# Patient Record
Sex: Male | Born: 1965 | Race: Asian | Hispanic: No | Marital: Married | Smoking: Never smoker
Health system: Southern US, Community
[De-identification: ages and names within clinical notes are randomized; demographics above are authoritative.]

---

## 2012-04-11 ENCOUNTER — Ambulatory Visit (INDEPENDENT_AMBULATORY_CARE_PROVIDER_SITE_OTHER): Payer: BC Managed Care – PPO | Admitting: Emergency Medicine

## 2012-04-11 VITALS — BP 121/73 | HR 83 | Temp 97.9°F | Resp 16 | Ht 66.0 in | Wt 163.4 lb

## 2012-04-11 DIAGNOSIS — J111 Influenza due to unidentified influenza virus with other respiratory manifestations: Secondary | ICD-10-CM

## 2012-04-11 DIAGNOSIS — R059 Cough, unspecified: Secondary | ICD-10-CM

## 2012-04-11 DIAGNOSIS — R05 Cough: Secondary | ICD-10-CM

## 2012-04-11 LAB — POCT INFLUENZA A/B: Influenza A, POC: NEGATIVE

## 2012-04-11 MED ORDER — OSELTAMIVIR PHOSPHATE 75 MG PO CAPS: 75.0000 mg | ORAL_CAPSULE | Freq: Two times a day (BID) | ORAL | Status: DC

## 2012-04-11 MED ORDER — HYDROCOD POLST-CHLORPHEN POLST 10-8 MG/5ML PO LQCR: 5.0000 mL | Freq: Two times a day (BID) | ORAL | Status: DC | PRN

## 2012-04-11 NOTE — Patient Instructions (Addendum)
Cm A (H1N1) (Influenza A [H1N1]) H1N1 tr??c ?y ???c g?i l "cm l?n" l m?t vi rt cm m?i gy m?t m?i cho ng??i. Vi rt H1N1 khc v?i cc vi rt cm theo ma. Tuy nhin, cc tri?u ch?ng c?a H1N1 t??ng t? nh? cm theo ma v n ly lan t? ng??i ny sang ng??i khc. B?n c th? c nguy c? b? nh?ng v?n ?? nghim tr?ng cao h?n n?u b?n c nh?ng tnh tr?ng y t? nghim tr?ng. CDC v T? Ch?c Y T? Th? Gi?i ?ang theo di cc tr??ng h?p ???c bo co trn kh?p th? gi?i. NGUYN NHN  Cm ???c xem l ly lan ch? y?u t? ng??i sang ng??i thng qua vi?c ho ho?c h?t h?i c?a ng??i b? nhi?m vi rt cm.  M?t ng??i c th? b? nhi?m cm khi ch?m vo th? g ? c vi rt trn ? v sau ? ch?m vo mi?ng ho?c m?i. TRI?U CH?NG  S?t.  ?au ??u.  M?t m?i.  Ho.  ?au h?ng.  Ch?y n??c m?i ho?c ng?t m?i.  ?au nh?c ton thn.  Tiu ch?y v nn m?a Nh?ng tri?u ch?ng ny ???c ?? c?p l nh?ng tri?u ch?ng 'gi?ng nh? cm'. Nhi?u tri?u ch?ng m?t m?i khc nhau bao g?m, c?m l?nh, c th? c nh?ng tri?u ch?ng t??ng t?. CH?N ?ON   C nh?ng xt nghi?m c th? cho bi?t b?n c b? nhi?m vi rt H1N1 khng.  Nh?ng tr??ng h?p ???c xc nh?n nhi?m H1N1 s? ???c bo co cho phng y t? qu?c gia ho?c ??a ph??ng.  Bc s? c th? c?n xt nghi?m ?? xc ??nh xem b?n c b? nhi?m trng do bi?n ch?ng c?a cm hay khng. H??NG D?N CH?M [place] T?I NH  Gi? c?p nh?t thng tin. Truy c?p vo trang web c?a CDC ?? bi?t nh?ng ?? xu?t m?i nh?t. Truy c?p vo [employer]tn. B?n c?ng c th? g?i ??n 1-800-CDC-INFO [phone]).  Yu c?u tr? gip s?m n?u b?n pht tri?n b?t k? tri?u ch?ng no ? trn.  N?u b?n c nguy c? cao t? bi?n ch?ng c?a cm, hy ni chuy?n v?i chuyn gia ch?m [place] y t? c?a b?n ngay sau khi b?n c nh?ng tri?u ch?ng gi?ng nh? cm. Nh?ng ng??i b? r?i ro cao h?n v?i bi?n ch?ng bao g?m:  Nh?ng ng??i t? 66 tu?i tr? ln.  Nh?ng ng??i c nh?ng tnh tr?ng y t? m?n tnh.  Ph? n? mang thai.  Tr? nh?.  Chuyn gia ch?m [place] y t? c  th? ?? xu?t thu?c khng vi rt ?? gip ?i?u tr? cm.  N?u b?n b? cm, hy ngh? ng?i th?t nhi?u, u?ng ?? n??c v dung d?ch ?? gi? cho n??c ti?u trong ho?c vng nh?t v trnh s? d?ng r??u ho?c thu?c l.  B?n c th? s? d?ng thu?c mua tr?c ti?p t?i hi?u thu?c ?? lm gi?m tri?u ch?ng cm n?u chuyn gia ch?m [place] y t? ph chu?n. (Khng bao gi? s? d?ng atpirin cho tr? em ho?c tr? v? thnh nin c nh?ng tri?u ch?ng gi?ng nh? cm, ??c bi?t khi b? s?t). ?I?U TR? N?u b? b?nh, b?n c th? s? d?ng thu?c khng vi rt. Nh?ng thu?c ny c th? gi?m nh? b?nh c?a b?n v lm cho b?n c?m th?y kh?e nhanh h?n. Nn ?i?u tr? ngay sau khi b?t ??u b? b?nh. N ch? c hi?u qu? n?u ???c s? d?ng trong ngy ??u tin khi b?t ??u b? b?nh. Ch? chuyn gia ch?m [place] y t? c?a b?n m?i  c th? k thu?c khng vi rt.  PHNG NG?A  Che m?i v mi?ng b?ng kh?n gi?y ho?c cnh tay khi ho ho?c h?t h?i. V?t b? kh?n gi?y.  R?a tay th??ng xuyn b?ng x phng v n??c ?m, ??c bi?t sau khi ho ho?c h?t h?i. Nh?ng ch?t t?y r?a c ch?a c?n c?ng hi?u qu? trong vi?c ch?ng l?i vi trng.  Trnh ch?m vo m?t, m?i ho?c mi?ng. ?y l m?t cch pht tn vi trng.  Trnh ti?p xc v?i ng??i b? b?nh. Tun th? khuy?n co y t? chung lin quan ??n vi?c ?ng c?a tr??ng h?c. Trnh ?m ?ng.  ? nh n?u b?n b? b?nh. H?n ch? ti?p xc v?i ng??i khc ?? trnh ly b?nh cho h?. Nh?ng ng??i b? nhi?m vi rt H1N1 c th? ly nhi?m sang ng??i khc b?t c? lc no t? 1 ngy tr??c khi c?m th?y b? b?nh cho ??n 5-7 ngy sau khi xu?t hi?n tri?u ch?ng cm.  V?cxin H1N1 c s?n ?? gip b?o v? ch?ng l?i vi rt. Ngoi v?cxin H1N1, b?n s? c?n tim phng H1N1 cho cm. V?cxin H1N1 v theo ma c th? s? d?ng trong cng ngy. CDC ??c bi?t khuyn nn s? d?ng v?cxin H1N1 cho:  Ph? n? mang [place].  Nh?ng ng??i s?ng cng v?i ho?c ch?m [place] tr? t? 6 thng tu?i tr? xu?ng.  Nhn vin d?ch v? ch?m [place] s?c kh?e v c?p c?u.  Nh?ng ng??i t? 6 thng tu?i ??n 24 tu?i.  Nh?ng ng??i ? ?? tu?i t? 25 ??n 64 c  nguy c? b? nhi?m H1N1 cao h?n do cc r?i lo?n s?c kh?e m?n tnh ho?c v?n ?? v? h? mi?n d?ch. M?T N? Trong mi tr??ng c?ng ??ng v t?i nh, vi?c s? d?ng m?t n? v kh?u trang N95 th??ng khng ???c khuyn dng. Trong m?t s? tr??ng h?p nh?t ??nh, m?t n? ho?c kh?u trang N95 c th? ???c s? d?ng cho nh?ng c nhn c nguy c? gia t?ng b? b?nh n?ng t? cm. Chuyn gia ch?m [place] y t? c?a b?n c th? t? v?n thm v? vi?c s? d?ng m?t n?. TRONG TR? EM, NH?NG D?U HI?U C?NH BO KH?N C?P C?N CH?M [place] Y T? KH?N C?P:  Th? g?p ho?c kh th?.  Mu da h?i xanh.  Khng u?ng ?? n??c.  Khng th?c gi?c ho?c khng t??ng tc bnh th??ng.  Om sm t?i m?c tr? khng mu?n ???c gi?Marland Kitchen  Nhi?t ?? ? mi?ng c?a con b?n trn 102 F (38,9 C), khng ???c ki?m sot b?i thu?c.  Con b?n trn 3 thng tu?i c nhi?t ?? ?o t?i h?u mn l 102 F (38,9 C) tr? ln.  Con b?n t? 3 thng tu?i tr? xu?ng c nhi?t ?? ?o t?i h?u mn l 100,4 F (38 C) tr? ln.  Tri?u ch?ng gi?ng nh? cm c?i thi?n nh?ng tr? l?i cng v?i s?t v ho t? h?n. TRONG NG??I L?N, NH?NG D?U HI?U C?NH BO KH?N C?P C?N CH?M [place] Y T? KH?N C?P:  Kh th? ho?c th? d?c.  ?au ho?c t?c ng?c ho?c b?ng.  Hoa m?t ??t ng?t.  S? l?n l?n.  Nn n?ng ho?c lin t?c.  Da h?i Angeline Slim.  Nhi?t ?? mi?ng c?a b?n trn 102 F (38,9 C), khng ???c ki?m sot b?i thu?c.  Tri?u ch?ng gi?ng nh? cm c?i thi?n nh?ng tr? l?i cng v?i s?t v ho t? h?n. HY NGAY L?P T?C THAM V?N V?I CHUYN GIA Y T? N?U:  B?n ho?c ai ? b?n bi?t  g?p ph?i b?t k? tri?u ch?ng no ? trn. Khi b?n ??n trung tm c?p c?u, hy ni cho nhn vin l? tn r?ng b?n ngh? mnh b? cm. B?n c th? ???c yu c?u ?eo m?t n? v/ho?c ng?i trong khu v?c cch ly ?? b?o v? nh?ng ng??i khc kh?i b? ly b?nh. ??M B?O B?N:   Hi?u cc h??ng d?n ny.  S? theo di tnh tr?ng c?a mnh.  S? yu c?u tr? gip ngay l?p t?c n?u b?n c?m th?y khng kh?e ho?c tnh tr?ng tr? nn t?i h?n. M?t s? thng tin ny ???c cung c?p mi?n ph b?i CDC.  Document  Released: 05/29/2009 Document Revised: 05/27/2011 [facility] Patient Information 2013 [place], [place].

## 2012-04-11 NOTE — Progress Notes (Signed)
[facility] [address] [place] [address] [phone]  Date:  04/11/2012   Name:  Manuel Boone   DOB:  12/12/1965   MRN:  [id number]  PCP:  No primary provider on file.    Chief Complaint: Cough   History of Present Illness:  Manuel Boone is a 47 y.o. very pleasant male patient who presents with the following:  Ill since yesterday with cough and fever, malaise, joint and muscle pain.  Cough is largely dry except in AM when he brings up some mucoid sputum.  Has some nasal congestion and drainage.  Had flu shot.  Ill contacts.  No improvement with OTC medication.  There is no problem list on file for this patient.   No past medical history on file.  No past surgical history on file.  History  Substance Use Topics  . Smoking status: Never Smoker   . Smokeless tobacco: Not on file  . Alcohol Use: Not on file    Family History  Problem Relation Age of Onset  . Hypertension Father     No Known Allergies  Medication list has been reviewed and updated.  No current outpatient prescriptions on file prior to visit.    Review of Systems:  As per HPI, otherwise negative.    Physical Examination: Filed Vitals:   04/11/12 1647  BP: 121/73  Pulse: 83  Temp: 97.9 F (36.6 C)  Resp: 16   Filed Vitals:   04/11/12 1647  Height: 5\' 6"  (1.676 m)  Weight: 163 lb 6.4 oz (74.118 kg)   Body mass index is 26.37 kg/(m^2). Ideal Body Weight: Weight in (lb) to have BMI = 25: 154.6   GEN: WDWN, NAD, Non-toxic, A & O x 3 HEENT: Atraumatic, Normocephalic. Neck supple. No masses, No LAD. Ears and Nose: No external deformity. CV: RRR, No M/G/R. No JVD. No thrill. No extra heart sounds. PULM: CTA B, no wheezes, crackles, rhonchi. No retractions. No resp. distress. No accessory muscle use. ABD: S, NT, ND, +BS. No rebound. No HSM. EXTR: No c/c/e NEURO Normal gait.  PSYCH: Normally interactive. Conversant. Not depressed or anxious appearing.  Calm  demeanor.    Assessment and Plan: Influenza tamiflu tussionex Follow up as needed  Carmelina Dane, MD  Results for orders placed in visit on 04/11/12  POCT INFLUENZA A/B      Component Value Range   Influenza A, POC Negative     Influenza B, POC Negative

## 2012-04-11 NOTE — Addendum Note (Signed)
Addended by: Morrell Riddle on: 04/11/2012 07:06 PM   Modules accepted: Orders

## 2014-08-11 ENCOUNTER — Ambulatory Visit (INDEPENDENT_AMBULATORY_CARE_PROVIDER_SITE_OTHER): Payer: 59 | Admitting: Family Medicine

## 2014-08-11 ENCOUNTER — Ambulatory Visit (INDEPENDENT_AMBULATORY_CARE_PROVIDER_SITE_OTHER): Payer: 59

## 2014-08-11 VITALS — BP 115/70 | HR 75 | Temp 98.6°F | Resp 17 | Ht 67.0 in | Wt 173.8 lb

## 2014-08-11 DIAGNOSIS — M545 Low back pain: Secondary | ICD-10-CM

## 2014-08-11 DIAGNOSIS — R312 Other microscopic hematuria: Secondary | ICD-10-CM | POA: Diagnosis not present

## 2014-08-11 DIAGNOSIS — Z13220 Encounter for screening for lipoid disorders: Secondary | ICD-10-CM | POA: Diagnosis not present

## 2014-08-11 DIAGNOSIS — R3129 Other microscopic hematuria: Secondary | ICD-10-CM

## 2014-08-11 LAB — COMPLETE METABOLIC PANEL WITH GFR
ALT: 16 U/L (ref 0–53)
AST: 17 U/L (ref 0–37)
Albumin: 4.1 g/dL (ref 3.5–5.2)
Alkaline Phosphatase: 54 U/L (ref 39–117)
BUN: 16 mg/dL (ref 6–23)
CALCIUM: 9.1 mg/dL (ref 8.4–10.5)
CO2: 31 meq/L (ref 19–32)
CREATININE: 0.76 mg/dL (ref 0.50–1.35)
Chloride: 103 mEq/L (ref 96–112)
GFR, Est African American: >89 mL/min
GLUCOSE: 83 mg/dL (ref 70–99)
Potassium: 4.2 mEq/L (ref 3.5–5.3)
Sodium: 139 mEq/L (ref 135–145)
TOTAL PROTEIN: 6.9 g/dL (ref 6.0–8.3)
Total Bilirubin: 0.7 mg/dL (ref 0.2–1.2)

## 2014-08-11 LAB — CBC WITH DIFFERENTIAL/PLATELET
BASOS ABS: 0 10*3/uL (ref 0.0–0.1)
Basophils Relative: 0 % (ref 0–1)
EOS PCT: 1 % (ref 0–5)
Eosinophils Absolute: 0.1 10*3/uL (ref 0.0–0.7)
HCT: 44.8 % (ref 39.0–52.0)
Hemoglobin: 15 g/dL (ref 13.0–17.0)
LYMPHS ABS: 1.9 10*3/uL (ref 0.7–4.0)
Lymphocytes Relative: 29 % (ref 12–46)
MCH: 29.8 pg (ref 26.0–34.0)
MCHC: 33.5 g/dL (ref 30.0–36.0)
MCV: 88.9 fL (ref 78.0–100.0)
MONO ABS: 0.6 10*3/uL (ref 0.1–1.0)
MONOS PCT: 10 % (ref 3–12)
MPV: 10.1 fL (ref 8.6–12.4)
NEUTROS ABS: 3.8 10*3/uL (ref 1.7–7.7)
Neutrophils Relative %: 60 % (ref 43–77)
Platelets: 195 10*3/uL (ref 150–400)
RBC: 5.04 MIL/uL (ref 4.22–5.81)
RDW: 13.8 % (ref 11.5–15.5)
WBC: 6.4 10*3/uL (ref 4.0–10.5)

## 2014-08-11 LAB — POCT UA - MICROSCOPIC ONLY
Bacteria, U Microscopic: NEGATIVE
CASTS, UR, LPF, POC: NEGATIVE
Epithelial cells, urine per micros: NEGATIVE
Yeast, UA: NEGATIVE

## 2014-08-11 LAB — LIPID PANEL
Cholesterol: 197 mg/dL (ref 0–200)
HDL: 44 mg/dL (ref >=40)
LDL CALC: 135 mg/dL — AB (ref 0–99)
TRIGLYCERIDES: 92 mg/dL (ref <150)
Total CHOL/HDL Ratio: 4.5 Ratio
VLDL: 18 mg/dL (ref 0–40)

## 2014-08-11 LAB — POCT URINALYSIS DIPSTICK
BILIRUBIN UA: NEGATIVE
GLUCOSE UA: NEGATIVE
Ketones, UA: NEGATIVE
LEUKOCYTES UA: NEGATIVE
Nitrite, UA: NEGATIVE
Protein, UA: NEGATIVE
Spec Grav, UA: 1.02
Urobilinogen, UA: 0.2
pH, UA: 5.5

## 2014-08-11 MED ORDER — MELOXICAM 15 MG PO TABS
15.0000 mg | ORAL_TABLET | Freq: Every day | ORAL | Status: DC
Start: 2014-08-11 — End: 2014-08-11

## 2014-08-11 MED ORDER — MELOXICAM 15 MG PO TABS: 15.0000 mg | ORAL_TABLET | Freq: Every day | ORAL | Status: DC

## 2014-08-11 MED ORDER — CYCLOBENZAPRINE HCL 10 MG PO TABS: 10.0000 mg | ORAL_TABLET | Freq: Three times a day (TID) | ORAL | Status: DC | PRN

## 2014-08-11 NOTE — Progress Notes (Signed)
[facility] [address], [place] [address] [phone]  Date:  08/11/2014   Name:  Wonda CeriseCuong Blakeley   DOB:  May 28, 1965   MRN:  [id number]  PCP:  No primary care provider on file.    History of Present Illness:  Joshuajames Hester MatesQuach is a 49 y.o. male patient [facility] chief complaint of back pain which occurred yesterday morning while he was lifting up, grabbing hold of light-weight shrubbery during yard work.  It was a sharp pain that subsided after a while, but the pain would arise at times with trying to sit down.  He has relief with leaning back and laying down with a pillow cradled beneath him.  He has taken 4 aleve with the pain which has helped.  He has no sob or dyspnea.  No trouble with urination, hematuria, and frequency.   No incontinence, stumbling, numbness, or tingling.   There are no active problems to display for this patient.   History reviewed. No pertinent past medical history.  History reviewed. No pertinent past surgical history.  History  Substance Use Topics  . Smoking status: Never Smoker   . Smokeless tobacco: Not on file  . Alcohol Use: Not on file    Family History  Problem Relation Age of Onset  . Hypertension Father     No Known Allergies  Medication list has been reviewed and updated.  No current outpatient prescriptions on file prior to visit.   No current facility-administered medications on file prior to visit.    ROS ROS otherwise unremarkable unless listed above.    Physical Examination: BP 115/70 mmHg  Pulse 75  Temp(Src) 98.6 F (37 C) (Oral)  Resp 17  Ht 5\' 7"  (1.702 m)  Wt 173 lb 12.8 oz (78.835 kg)  BMI 27.21 kg/m2  SpO2 98% Ideal Body Weight: Weight in (lb) to have BMI = 25: 159.3  Physical Exam  Constitutional: He appears well-developed and well-nourished. No distress.  Cardiovascular: Normal rate, regular rhythm, normal heart sounds and intact distal pulses.  Exam reveals no gallop and no  friction rub.   No murmur heard. Pulmonary/Chest: Effort normal and breath sounds normal. No respiratory distress. He has no decreased breath sounds. He has no wheezes. He has no rhonchi.  Abdominal: Soft. Bowel sounds are normal. He exhibits no mass. There is no hepatomegaly. There is no tenderness. There is no CVA tenderness. No hernia. Hernia confirmed negative in the right inguinal area and confirmed negative in the left inguinal area.  Musculoskeletal:  No spinous tenderness or proximal musculature.  Spasm not appreciated with palpation.  Torso rotation sparks the pain with movement toward right.  Some with left torso rotation, however no decrease in ROM.  Lateral deviation with pain at right side with right sided movement.  None at left deviation.  Forward flexion limited though, he states pain is not incited.    Neurological: He is alert. No cranial nerve deficit. Coordination normal.  Reflexes limited with patient stiffness and controlled.    Skin: Skin is warm.  Psychiatric: He has a normal mood and affect. His behavior is normal.    [facility] reading (PRIMARY) by  Dr. Patsy Lageropland: Normal findings  Assessment and Plan: 49 year old male is here today for low back pain.  Likely low back strain.  Advised mobic, ice, heat before stretch followed by back.  Stretches verbally given and instructed in handout.  Will culture urine.  Lipid panel given  from patient request.  Urine culture for hematuria.  Will consider referral if no growth.    Low back pain without sciatica, unspecified back pain laterality - Plan: CBC with Differential/Platelet  Bilateral low back pain, with sciatica presence unspecified - Plan: DG Lumbar Spine Complete, DG Sacrum/Coccyx, COMPLETE METABOLIC PANEL WITH GFR, POCT UA - Microscopic Only, POCT urinalysis dipstick, CBC with Differential/Platelet, Urine culture, meloxicam (MOBIC) 15 MG tablet, cyclobenzaprine (FLEXERIL) 10 MG tablet, CANCELED: POCT CBC, DISCONTINUED: meloxicam  (MOBIC) 15 MG tablet, DISCONTINUED: cyclobenzaprine (FLEXERIL) 10 MG tablet  Screening for lipid disorders - Plan: Lipid panel  Microscopic hematuria - Plan: Urine culture

## 2014-08-11 NOTE — Patient Instructions (Signed)

## 2014-08-12 ENCOUNTER — Telehealth: Payer: Self-pay

## 2014-08-12 LAB — URINE CULTURE

## 2014-08-12 NOTE — Telephone Encounter (Signed)
Pt rx'd meloxicam and flexeril. He was just seen yesterday. Is there anything else to do for him?

## 2014-08-12 NOTE — Telephone Encounter (Signed)
Patient seen yesterday by Manuel PlattStephanie English, PA-C and he says he is still in pain. Also asked if lab results were ready but I told him that they were not reviewed yet since they were just done yesterday. Please call back at [phone].

## 2014-08-12 NOTE — Telephone Encounter (Signed)
Spoke with pt. Advised he can add tylenol 1 gm TID for pain not controlled by mobic and flexeril. Discussed heat, massage and gentle stretching. Discussed red flag symptoms that would require being seen urgently.

## 2014-08-14 ENCOUNTER — Encounter: Payer: Self-pay | Admitting: Physician Assistant

## 2015-02-15 ENCOUNTER — Ambulatory Visit (INDEPENDENT_AMBULATORY_CARE_PROVIDER_SITE_OTHER): Payer: 59 | Admitting: Family Medicine

## 2015-02-15 ENCOUNTER — Encounter: Payer: Self-pay | Admitting: Family Medicine

## 2015-02-15 VITALS — BP 100/80 | HR 86 | Temp 98.4°F | Resp 16 | Ht 66.25 in | Wt 168.6 lb

## 2015-02-15 DIAGNOSIS — Z125 Encounter for screening for malignant neoplasm of prostate: Secondary | ICD-10-CM | POA: Diagnosis not present

## 2015-02-15 DIAGNOSIS — Z9189 Other specified personal risk factors, not elsewhere classified: Secondary | ICD-10-CM

## 2015-02-15 DIAGNOSIS — Z23 Encounter for immunization: Secondary | ICD-10-CM

## 2015-02-15 DIAGNOSIS — Z119 Encounter for screening for infectious and parasitic diseases, unspecified: Secondary | ICD-10-CM

## 2015-02-15 DIAGNOSIS — F418 Other specified anxiety disorders: Secondary | ICD-10-CM

## 2015-02-15 LAB — HEPATITIS PANEL, ACUTE
HCV Ab: NEGATIVE
HEP B C IGM: NONREACTIVE
Hep A IgM: NONREACTIVE
Hepatitis B Surface Ag: NEGATIVE

## 2015-02-15 NOTE — Patient Instructions (Signed)
It was good to see you today I will be in touch with your labs At this time, the best thing you can do for your health is to eat a healthy diet with lots of vegetables and exercise most days of the week.  Otherwise it appears that you are in good health!

## 2015-02-15 NOTE — Progress Notes (Signed)
[facility] [address], [place] [address] [phone]  Date:  02/15/2015   Name:  Manuel CeriseCuong Boone   DOB:  12/13/1965   MRN:  [id number]  PCP:  Abbe AmsterdamOPLAND,Mychelle Kendra, MD    Chief Complaint: bloodwork; per pt cancer prevention for lungs; and chills   History of Present Illness:  Manuel Boone is a 49 y.o. very pleasant male patient who presents with the following:  Here today to address a couple of concerns- he has various anxieties about his health He would like to be screened for hepatitis, prostate cancer.   2 weeks ago he noted a HA and intermittent fever.  This went away.  Also during that time he felt like one of his testicles was larger than the other- this also resolved  He is a non-smoker.  He works in a [employer] and wonders if he is at risk of cancer from fumes He uses a supplement meant to do all sorts of things for him- wonders if this is ok to use  He would like a flu shot  He wonders if taking very cold showers will prevent hair loss - he heard this somewhere  Admits that he does not exercise much, he sits a lot at his job. Sometimes his back will ache There are no active problems to display for this patient.   No past medical history on file.  No past surgical history on file.  Social History  Substance Use Topics  . Smoking status: Never Smoker   . Smokeless tobacco: None  . Alcohol Use: None    Family History  Problem Relation Age of Onset  . Hypertension Father     No Known Allergies  Medication list has been reviewed and updated.  Current Outpatient Prescriptions on File Prior to Visit  Medication Sig Dispense Refill  . cyclobenzaprine (FLEXERIL) 10 MG tablet Take 1 tablet (10 mg total) by mouth 3 (three) times daily as needed for muscle spasms. (Patient not taking: Reported on 02/15/2015) 30 tablet 0  . meloxicam (MOBIC) 15 MG tablet Take 1 tablet (15 mg total) by mouth daily. (Patient not taking: Reported on 02/15/2015) 30  tablet 1   No current facility-administered medications on file prior to visit.    Review of Systems:  As per HPI- otherwise negative.   Physical Examination: Filed Vitals:   02/15/15 0932  BP: 100/80  Pulse: 86  Temp: 98.4 F (36.9 C)  Resp: 16   Filed Vitals:   02/15/15 0932  Height: 5' 6.25" (1.683 m)  Weight: 168 lb 9.6 oz (76.476 kg)   Body mass index is 27 kg/(m^2). Ideal Body Weight: Weight in (lb) to have BMI = 25: 155.7  GEN: WDWN, NAD, Non-toxic, A & O x 3, looks well, overweight HEENT: Atraumatic, Normocephalic. Neck supple. No masses, No LAD. Ears and Nose: No external deformity. CV: RRR, No M/G/R. No JVD. No thrill. No extra heart sounds. PULM: CTA B, no wheezes, crackles, rhonchi. No retractions. No resp. distress. No accessory muscle use. ABD: S, NT, ND, +BS. No rebound. No HSM. EXTR: No c/c/e GU: normal testicles and penis, no swelling or masses NEURO Normal gait.  PSYCH: Normally interactive. Conversant. Not depressed or anxious appearing.  Calm demeanor.    Assessment and Plan: Anxiety about health  Screening examination for infectious disease - Plan: Hepatitis panel, acute  Immunization due - Plan: Flu Vaccine QUAD 36+ mos IM  Screening for prostate cancer - Plan: PSA  Sedentary  lifestyle  Ok to have flu shot today Counseled that there is no indication for screening him for lung cancer.  He is a little young for a PSA but will do this at his request As he is very concerned about his health encouraged him to start exercising; explained that this is probavly the best thing he can do to maintain his health Will plan further follow- up pending labs.   Signed Abbe Amsterdam, MD

## 2015-02-16 ENCOUNTER — Encounter: Payer: Self-pay | Admitting: Family Medicine

## 2015-02-16 LAB — PSA: PSA: 2.8 ng/mL (ref <=4.00)

## 2015-04-07 ENCOUNTER — Encounter: Payer: Self-pay | Admitting: Family Medicine

## 2015-04-12 ENCOUNTER — Encounter: Payer: Self-pay | Admitting: Family Medicine

## 2015-11-03 ENCOUNTER — Ambulatory Visit (INDEPENDENT_AMBULATORY_CARE_PROVIDER_SITE_OTHER): Payer: BLUE CROSS/BLUE SHIELD | Admitting: Physician Assistant

## 2015-11-03 VITALS — BP 110/68 | HR 115 | Temp 98.3°F | Resp 18 | Ht 66.25 in | Wt 169.0 lb

## 2015-11-03 DIAGNOSIS — T63441A Toxic effect of venom of bees, accidental (unintentional), initial encounter: Secondary | ICD-10-CM

## 2015-11-03 DIAGNOSIS — T7840XA Allergy, unspecified, initial encounter: Secondary | ICD-10-CM

## 2015-11-03 MED ORDER — RANITIDINE HCL 150 MG PO TABS: 150.0000 mg | ORAL_TABLET | Freq: Two times a day (BID) | ORAL | 0 refills | Status: DC

## 2015-11-03 MED ORDER — CETIRIZINE HCL 10 MG PO TABS: 10.0000 mg | ORAL_TABLET | Freq: Every day | ORAL | 11 refills | Status: DC

## 2015-11-03 MED ORDER — METHYLPREDNISOLONE SODIUM SUCC 125 MG IJ SOLR
125.0000 mg | Freq: Once | INTRAMUSCULAR | Status: AC
  Administered 2015-11-03: 125 mg via INTRAVENOUS

## 2015-11-03 MED ORDER — PREDNISONE 20 MG PO TABS: ORAL_TABLET | ORAL | 0 refills | Status: AC

## 2015-11-03 NOTE — Patient Instructions (Signed)
     IF you received an x-ray today, you will receive an invoice from [facility]. Please contact [facility] at [phone] with questions or concerns regarding your invoice.   IF you received labwork today, you will receive an invoice from [facility]. Please contact [facility] at [phone] with questions or concerns regarding your invoice.   Our billing staff will not be able to assist you with questions regarding bills from these companies.  You will be contacted with the lab results as soon as they are available. The fastest way to get your results is to activate your My Chart account. Instructions are located on the last page of this paperwork. If you have not heard from us regarding the results in 2 weeks, please contact this office.      

## 2015-11-03 NOTE — Progress Notes (Signed)
11/03/2015 5:50 PM   DOB: Oct 10, 1965 / MRN: [id number]  SUBJECTIVE:  Manuel Boone is a 50 y.o. male presenting for being stung by a bee about 1 hour ago.  He has a bite on his arm and a bite on his scalp.  He complains of itching all over as well as rash.  Complains of feeling some tightness in his chest as well.  He denies lip, throat, tongue swelling.   He has No Known Allergies.   He  has no past medical history on file.    He  reports that he has never smoked. He has never used smokeless tobacco. He reports that he does not use drugs. He  reports that he currently engages in sexual activity. He reports using the following method of birth control/protection: None. The patient  has no past surgical history on file.  His family history includes Hypertension in his father.  Review of Systems  Skin: Positive for itching and rash.    The problem list and medications were reviewed and updated by myself where necessary and exist elsewhere in the encounter.   OBJECTIVE:  BP 110/68 (BP Location: Right Arm, Patient Position: Sitting, Cuff Size: Small)   Pulse (!) 115   Temp 98.3 F (36.8 C) (Oral)   Resp 18   Ht 5' 6.25" (1.683 m)   Wt 169 lb (76.7 kg)   SpO2 96%   BMI 27.07 kg/m   Physical Exam  Constitutional: He is oriented to person, place, and time.  Cardiovascular: Normal rate, regular rhythm, normal heart sounds and intact distal pulses.   Rate 94 on recheck.   Pulmonary/Chest: Effort normal and breath sounds normal.  Abdominal: Soft. Bowel sounds are normal.  Musculoskeletal: Normal range of motion.  Neurological: He is alert and oriented to person, place, and time. He has normal reflexes.  Skin: Skin is warm and dry. Rash (hives) noted.  Psychiatric: He has a normal mood and affect.          No results found for this or any previous visit (from the past 72 hour(s)).  No results found.  ASSESSMENT AND PLAN  Jabriel was seen today for insect bite.  Diagnoses  and all orders for this visit:  Allergic reaction, initial encounter: Patient held in the clinic for about 1.5 hours after injection and zyrtec/ranitidine admin.  The rash improved and he was less itchy.  He did not have any swelling about the oropharynx at any time.  He felt well enough to leave and did ask if he could return to work to "finish working on [employer]'s fingernails."   -     methylPREDNISolone sodium succinate (SOLU-MEDROL) 125 mg/2 mL injection 125 mg; Inject 2 mLs (125 mg total) into the vein once. -     predniSONE (DELTASONE) 20 MG tablet; Take 3 in the morning for 3 days, then 2 in the morning for 3 days, and then 1 in the morning for 3 days.  Bee sting reaction, accidental or unintentional, initial encounter -     ranitidine (ZANTAC) 150 MG tablet; Take 1 tablet (150 mg total) by mouth 2 (two) times daily. -     cetirizine (ZYRTEC) 10 MG tablet; Take 1 tablet (10 mg total) by mouth daily.    The patient is advised to call or return to clinic if he does not see an improvement in symptoms, or to seek the care of the closest emergency department if he worsens with the above plan.  Deliah BostonMichael Kimbria Camposano, MHS, PA-C [facility] 11/03/2015 5:50 PM

## 2016-02-19 ENCOUNTER — Ambulatory Visit (INDEPENDENT_AMBULATORY_CARE_PROVIDER_SITE_OTHER): Payer: BLUE CROSS/BLUE SHIELD | Admitting: Family Medicine

## 2016-02-19 VITALS — BP 104/66 | HR 96 | Temp 98.4°F | Resp 17 | Ht 66.0 in | Wt 164.0 lb

## 2016-02-19 DIAGNOSIS — R220 Localized swelling, mass and lump, head: Secondary | ICD-10-CM | POA: Diagnosis not present

## 2016-02-19 DIAGNOSIS — L509 Urticaria, unspecified: Secondary | ICD-10-CM

## 2016-02-19 DIAGNOSIS — H02849 Edema of unspecified eye, unspecified eyelid: Secondary | ICD-10-CM

## 2016-02-19 MED ORDER — EPINEPHRINE 0.3 MG/0.3ML IJ SOAJ: 0.3000 mg | Freq: Once | INTRAMUSCULAR | 0 refills | Status: AC

## 2016-02-19 NOTE — Patient Instructions (Addendum)
Take cetirizine 10 mg 1 pill once per day, and can take Zantac twice per day until seen by allergist.  If you have hives with these 2 medications, you can take Benadryl over-the-counter, but only if needed. I will refer you to an allergist for various allergy testing, but if you do have a reaction that causes tongue swelling, tightness or scratchy throat, shortness of breath, or any progressive swelling in the face, inject the epinephrine pen into your leg, and call 911 or go immediately to the emergency room.   Hives Hives (urticaria) are itchy, red, swollen areas on your skin. Hives can appear on any part of your body and can vary in size. They can be as small as the tip of a pen or much larger. Hives often fade within 24 hours (acute hives). In other cases, new hives appear after old ones fade. This cycle can continue for several days or weeks (chronic hives). Hives result from your body's reaction to an irritant or to something that you are allergic to (trigger). When you are exposed to a trigger, your body releases a chemical (histamine) that causes redness, itching, and swelling. You can get hives immediately after being exposed to a trigger or hours later. Hives do not spread from person to person (are not contagious). Your hives may get worse with scratching, exercise, and emotional stress. What are the causes? Causes of this condition include:  Allergies to certain foods or ingredients.  Insect bites or stings.  Exposure to pollen or pet dander.  Contact with latex or chemicals.  Spending time in sunlight, heat, or cold (exposure).  Exercise.  Stress. You can also get hives from some medical conditions and treatments. These include:  Viruses, including the common cold.  Bacterial infections, such as urinary tract infections and strep throat.  Disorders such as vasculitis, lupus, or thyroid disease.  Certain medications.  Allergy shots.  Blood transfusions. Sometimes,  the cause of hives is not known (idiopathic hives). What increases the risk? This condition is more likely to develop in:  Women.  People who have food allergies, especially to citrus fruits, milk, eggs, peanuts, tree nuts, or shellfish.  People who are allergic to:  Medicines.  Latex.  Insects.  Animals.  Pollen.  People who have certain medical conditions, includinglupus or thyroid disease. What are the signs or symptoms? The main symptom of this condition is raised, itchyred or white bumps or patches on your skin. These areas may:  Become large and swollen (welts).  Change in shape and location, quickly and repeatedly.  Be separate hives or connect over a large area of skin.  Sting or become painful.  Turn white when pressed in the center (blanch). In severe cases, yourhands, feet, and face may also become swollen. This may occur if hives develop deeper in your skin. How is this diagnosed? This condition is diagnosed based on your symptoms, medical history, and physical exam. Your skin, urine, or blood may be tested to find out what is causing your hives and to rule out other health issues. Your health care provider may also remove a small sample of skin from the affected area and examine it under a microscope (biopsy). How is this treated? Treatment depends on the severity of your condition. Your health care provider may recommend using cool, wet cloths (cool compresses) or taking cool showers to relieve itching. Hives are sometimes treated with medicines, including:  Antihistamines.  Corticosteroids.  Antibiotics.  An injectable medicine (omalizumab). Your health  care provider may prescribe this if you have chronic idiopathic hives and you continue to have symptoms even after treatment with antihistamines. Severe cases may require an emergency injection of adrenaline (epinephrine) to prevent a life-threatening allergic reaction (anaphylaxis). Follow these  instructions at home: Medicines  Take or apply over-the-counter and prescription medicines only as told by your health care provider.  If you were prescribed an antibiotic medicine, use it as told by your health care provider. Do not stop taking the antibiotic even if you start to feel better. Skin Care  Apply cool compresses to the affected areas.  Do not scratch or rub your skin. General instructions  Do not take hot showers or baths. This can make itching worse.  Do not wear tight-fitting clothing.  Use sunscreen and wear protective clothing when you are outside.  Avoid any substances that cause your hives. Keep a journal to help you track what causes your hives. Write down:  What medicines you take.  What you eat and drink.  What products you use on your skin.  Keep all follow-up visits as told by your health care provider. This is important. Contact a health care provider if:  Your symptoms are not controlled with medicine.  Your joints are painful or swollen. Get help right away if:  You have a fever.  You have pain in your abdomen.  Your tongue or lips are swollen.  Your eyelids are swollen.  Your chest or throat feels tight.  You have trouble breathing or swallowing. These symptoms may represent a serious problem that is an emergency. Do not wait to see if the symptoms will go away. Get medical help right away. Call your local emergency services (911 in the [place] Do not drive yourself to the hospital.  This information is not intended to replace advice given to you by your health care provider. Make sure you discuss any questions you have with your health care provider. Document Released: 03/04/2005 Document Revised: 08/02/2015 Document Reviewed: 12/21/2014 Elsevier Interactive Patient Education  2017 [employer]   IF you received an x-ray today, you will receive an invoice from [facility]. Please contact [facility] at [phone]  with questions or concerns regarding your invoice.   IF you received labwork today, you will receive an invoice from [facility]. Please contact [facility] at [phone] with questions or concerns regarding your invoice.   Our billing staff will not be able to assist you with questions regarding bills from these companies.  You will be contacted with the lab results as soon as they are available. The fastest way to get your results is to activate your My Chart account. Instructions are located on the last page of this paperwork. If you have not heard from us regarding the results in 2 weeks, please contact this office.

## 2016-02-19 NOTE — Progress Notes (Signed)
Subjective:  By signing my name below, I, Stann Ore, attest that this documentation has been prepared under the direction and in the presence of Meredith Staggers, MD. Electronically Signed: Stann Ore, Scribe. 02/19/2016 , 7:10 PM .  Patient was seen in Room 12 .   Patient ID: Manuel Boone, male    DOB: 02-12-1966, 50 y.o.   MRN: [id number] Chief Complaint  Patient presents with  . Rash    On back and abdomen.    HPI Manuel Boone is a 50 y.o. male  Here for rash over his back and his abdomen. He was seen in August for possible allergic reaction after being stung by a yellow jacket 1 hour prior. He had diffuse urticarial rash at that time.   Patient states his itchy rash started about 3 weeks ago (mid-November). He had rash everywhere, noticed after eating beef or chicken. He didn't have this problem in the past. He denies being bitten or stung by a bug. Initially, his rash would last for a few hours and did not need to take medication. He started taking benadryl about a week ago, and takes it once to twice a day, which usually relieves the rash. He also noticed his eye swelling up 3 times and lip swelling twice over the past week. He would treat with benadryl each time. He has some chest tightness but denies any trouble breathing. He denies taking any BP medication.   He informs his friend was also bitten by a bug. She was treated with a shot as well. Her rash was resolved, but returned after eating certain foods.   Patient Active Problem List   Diagnosis Date Noted  . Anxiety about health 02/15/2015   No past medical history on file. No past surgical history on file. No Known Allergies Prior to Admission medications   Medication Sig Start Date End Date Taking? Authorizing Provider  diphenhydrAMINE (BENADRYL) 25 MG tablet Take 25 mg by mouth every 6 (six) hours as needed.   Yes Historical Provider, MD  cetirizine (ZYRTEC) 10 MG tablet Take 1 tablet (10 mg total) by mouth  daily. Patient not taking: Reported on 02/19/2016 11/03/15   Ofilia Neas, PA-C  ranitidine (ZANTAC) 150 MG tablet Take 1 tablet (150 mg total) by mouth 2 (two) times daily. Patient not taking: Reported on 02/19/2016 11/03/15   Ofilia Neas, PA-C   Social History   Social History  . Marital status: Single    Spouse name: N/A  . Number of children: N/A  . Years of education: N/A   Occupational History  . Not on file.   Social History Main Topics  . Smoking status: Never Smoker  . Smokeless tobacco: Never Used  . Alcohol use Not on file  . Drug use: No  . Sexual activity: Yes    Birth control/ protection: None   Other Topics Concern  . Not on file   Social History Narrative  . No narrative on file   Review of Systems  Constitutional: Negative for fatigue and unexpected weight change.  HENT: Positive for facial swelling. Negative for trouble swallowing.   Eyes: Negative for visual disturbance.  Respiratory: Positive for chest tightness. Negative for apnea, cough, shortness of breath and wheezing.   Cardiovascular: Negative for chest pain, palpitations and leg swelling.  Gastrointestinal: Negative for abdominal pain and blood in stool.  Skin: Positive for rash.  Neurological: Negative for dizziness, light-headedness and headaches.       Objective:  Physical Exam  Constitutional: He is oriented to person, place, and time. He appears well-developed and well-nourished.  HENT:  Head: Normocephalic and atraumatic.  Right Ear: Tympanic membrane, external ear and ear canal normal.  Left Ear: Tympanic membrane, external ear and ear canal normal.  Nose: No rhinorrhea.  Mouth/Throat: Oropharynx is clear and moist and mucous membranes are normal. No oral lesions. No oropharyngeal exudate or posterior oropharyngeal erythema.  No oral lesions, no lip swelling  Eyes: Conjunctivae are normal. Pupils are equal, round, and reactive to light.  Neck: Neck supple.  Cardiovascular:  Normal rate, regular rhythm, normal heart sounds and intact distal pulses.   No murmur heard. Pulmonary/Chest: Effort normal and breath sounds normal. He has no decreased breath sounds. He has no wheezes. He has no rhonchi. He has no rales.  Abdominal: Soft. There is no tenderness.  Lymphadenopathy:    He has no cervical adenopathy.  Neurological: He is alert and oriented to person, place, and time.  Skin: Skin is warm and dry. No rash noted.  Diffuse urticarial lesions across abdomen, as well as upper chest; few faint urticarial volar forearms; few small urticarial lesions down his legs  Psychiatric: He has a normal mood and affect. His behavior is normal.  Vitals reviewed.   Vitals:   02/19/16 1715  BP: 104/66  Pulse: 96  Resp: 17  Temp: 98.4 F (36.9 C)  TempSrc: Oral  SpO2: 98%  Weight: 164 lb (74.4 kg)  Height: 5\' 6"  (1.676 m)      Assessment & Plan:    Manuel Boone is a 50 y.o. male Urticaria - Plan: Ambulatory referral to Allergy, EPINEPHrine 0.3 mg/0.3 mL IJ SOAJ injection  Lip swelling - Plan: Ambulatory referral to Allergy, EPINEPHrine 0.3 mg/0.3 mL IJ SOAJ injection  Swelling of eyelid, unspecified laterality - Plan: Ambulatory referral to Allergy, EPINEPHrine 0.3 mg/0.3 mL IJ SOAJ injection  Recurrent urticaria, unknown cause. Differential includes hepatic urticaria versus food allergy. Previous symptoms of lip, eye swelling possible angioedema, but does not appear to have those in office.  -Refer to allergist  -Continue Zyrtec 10 mg daily, Zantac 150 milligrams twice a day for now.   -Keep a record of foods to see if any particular food tends to cause issue to discuss with allergist.   -  If needed, can also take Benadryl over-the-counter, additive side effects discussed with other medications.  -if tongue swelling, or any progressive face swelling, or  Respiratory symptoms, EpiPen was prescribed, correct use discussed,  and advised to proceed directly to ER or  call 911 if needed. Understanding expressed.  Meds ordered this encounter  Medications  . diphenhydrAMINE (BENADRYL) 25 MG tablet    Sig: Take 25 mg by mouth every 6 (six) hours as needed.  Marland Kitchen. EPINEPHrine 0.3 mg/0.3 mL IJ SOAJ injection    Sig: Inject 0.3 mLs (0.3 mg total) into the muscle once. If allergic reaction.    Dispense:  1 Device    Refill:  0   Patient Instructions    Take cetirizine 10 mg 1 pill once per day, and can take Zantac twice per day until seen by allergist.  If you have hives with these 2 medications, you can take Benadryl over-the-counter, but only if needed. I will refer you to an allergist for various allergy testing, but if you do have a reaction that causes tongue swelling, tightness or scratchy throat, shortness of breath, or any progressive swelling in the face, inject the epinephrine pen into  your leg, and call 911 or go immediately to the emergency room.   Hives Hives (urticaria) are itchy, red, swollen areas on your skin. Hives can appear on any part of your body and can vary in size. They can be as small as the tip of a pen or much larger. Hives often fade within 24 hours (acute hives). In other cases, new hives appear after old ones fade. This cycle can continue for several days or weeks (chronic hives). Hives result from your body's reaction to an irritant or to something that you are allergic to (trigger). When you are exposed to a trigger, your body releases a chemical (histamine) that causes redness, itching, and swelling. You can get hives immediately after being exposed to a trigger or hours later. Hives do not spread from person to person (are not contagious). Your hives may get worse with scratching, exercise, and emotional stress. What are the causes? Causes of this condition include:  Allergies to certain foods or ingredients.  Insect bites or stings.  Exposure to pollen or pet dander.  Contact with latex or chemicals.  Spending time in  sunlight, heat, or cold (exposure).  Exercise.  Stress. You can also get hives from some medical conditions and treatments. These include:  Viruses, including the common cold.  Bacterial infections, such as urinary tract infections and strep throat.  Disorders such as vasculitis, lupus, or thyroid disease.  Certain medications.  Allergy shots.  Blood transfusions. Sometimes, the cause of hives is not known (idiopathic hives). What increases the risk? This condition is more likely to develop in:  Women.  People who have food allergies, especially to citrus fruits, milk, eggs, peanuts, tree nuts, or shellfish.  People who are allergic to:  Medicines.  Latex.  Insects.  Animals.  Pollen.  People who have certain medical conditions, includinglupus or thyroid disease. What are the signs or symptoms? The main symptom of this condition is raised, itchyred or white bumps or patches on your skin. These areas may:  Become large and swollen (welts).  Change in shape and location, quickly and repeatedly.  Be separate hives or connect over a large area of skin.  Sting or become painful.  Turn white when pressed in the center (blanch). In severe cases, yourhands, feet, and face may also become swollen. This may occur if hives develop deeper in your skin. How is this diagnosed? This condition is diagnosed based on your symptoms, medical history, and physical exam. Your skin, urine, or blood may be tested to find out what is causing your hives and to rule out other health issues. Your health care provider may also remove a small sample of skin from the affected area and examine it under a microscope (biopsy). How is this treated? Treatment depends on the severity of your condition. Your health care provider may recommend using cool, wet cloths (cool compresses) or taking cool showers to relieve itching. Hives are sometimes treated with medicines,  including:  Antihistamines.  Corticosteroids.  Antibiotics.  An injectable medicine (omalizumab). Your health care provider may prescribe this if you have chronic idiopathic hives and you continue to have symptoms even after treatment with antihistamines. Severe cases may require an emergency injection of adrenaline (epinephrine) to prevent a life-threatening allergic reaction (anaphylaxis). Follow these instructions at home: Medicines  Take or apply over-the-counter and prescription medicines only as told by your health care provider.  If you were prescribed an antibiotic medicine, use it as told by your health care provider.  Do not stop taking the antibiotic even if you start to feel better. Skin Care  Apply cool compresses to the affected areas.  Do not scratch or rub your skin. General instructions  Do not take hot showers or baths. This can make itching worse.  Do not wear tight-fitting clothing.  Use sunscreen and wear protective clothing when you are outside.  Avoid any substances that cause your hives. Keep a journal to help you track what causes your hives. Write down:  What medicines you take.  What you eat and drink.  What products you use on your skin.  Keep all follow-up visits as told by your health care provider. This is important. Contact a health care provider if:  Your symptoms are not controlled with medicine.  Your joints are painful or swollen. Get help right away if:  You have a fever.  You have pain in your abdomen.  Your tongue or lips are swollen.  Your eyelids are swollen.  Your chest or throat feels tight.  You have trouble breathing or swallowing. These symptoms may represent a serious problem that is an emergency. Do not wait to see if the symptoms will go away. Get medical help right away. Call your local emergency services (911 in the [place] Do not drive yourself to the hospital.  This information is not intended to replace  advice given to you by your health care provider. Make sure you discuss any questions you have with your health care provider. Document Released: 03/04/2005 Document Revised: 08/02/2015 Document Reviewed: 12/21/2014 Elsevier Interactive Patient Education  2017 [employer]   IF you received an x-ray today, you will receive an invoice from [facility]. Please contact [facility] at [phone] with questions or concerns regarding your invoice.   IF you received labwork today, you will receive an invoice from [facility]. Please contact [facility] at [phone] with questions or concerns regarding your invoice.   Our billing staff will not be able to assist you with questions regarding bills from these companies.  You will be contacted with the lab results as soon as they are available. The fastest way to get your results is to activate your My Chart account. Instructions are located on the last page of this paperwork. If you have not heard from us regarding the results in 2 weeks, please contact this office.        I personally performed the services described in this documentation, which was scribed in my presence. The recorded information has been reviewed and considered, and addended by me as needed.   Signed,   Meredith StaggersJeffrey Shahira Fiske, MD [facility].  02/20/16 11:07 PM

## 2016-03-06 ENCOUNTER — Ambulatory Visit (INDEPENDENT_AMBULATORY_CARE_PROVIDER_SITE_OTHER): Payer: BLUE CROSS/BLUE SHIELD | Admitting: Family Medicine

## 2016-03-06 ENCOUNTER — Encounter: Payer: Self-pay | Admitting: Family Medicine

## 2016-03-06 VITALS — BP 108/64 | HR 76 | Temp 98.0°F | Ht 66.0 in | Wt 163.4 lb

## 2016-03-06 DIAGNOSIS — L509 Urticaria, unspecified: Secondary | ICD-10-CM

## 2016-03-06 DIAGNOSIS — Z131 Encounter for screening for diabetes mellitus: Secondary | ICD-10-CM

## 2016-03-06 DIAGNOSIS — Z130 Encounter for screening for diseases of the blood and blood-forming organs and certain disorders involving the immune mechanism: Secondary | ICD-10-CM | POA: Diagnosis not present

## 2016-03-06 DIAGNOSIS — Z13220 Encounter for screening for lipoid disorders: Secondary | ICD-10-CM | POA: Diagnosis not present

## 2016-03-06 LAB — LIPID PANEL
CHOL/HDL RATIO: 5
Cholesterol: 206 mg/dL — ABNORMAL HIGH (ref 0–200)
HDL: 41.7 mg/dL (ref >39.00)
LDL CALC: 151 mg/dL — AB (ref 0–99)
NonHDL: 164.1
TRIGLYCERIDES: 66 mg/dL (ref 0.0–149.0)
VLDL: 13.2 mg/dL (ref 0.0–40.0)

## 2016-03-06 LAB — COMPREHENSIVE METABOLIC PANEL
ALT: 16 U/L (ref 0–53)
AST: 16 U/L (ref 0–37)
Albumin: 4 g/dL (ref 3.5–5.2)
Alkaline Phosphatase: 56 U/L (ref 39–117)
BUN: 15 mg/dL (ref 6–23)
CALCIUM: 9.2 mg/dL (ref 8.4–10.5)
CHLORIDE: 102 meq/L (ref 96–112)
CO2: 32 meq/L (ref 19–32)
Creatinine, Ser: 0.85 mg/dL (ref 0.40–1.50)
GFR: 101.47 mL/min (ref >60.00)
Glucose, Bld: 104 mg/dL — ABNORMAL HIGH (ref 70–99)
POTASSIUM: 4.3 meq/L (ref 3.5–5.1)
Sodium: 138 mEq/L (ref 135–145)
Total Bilirubin: 0.6 mg/dL (ref 0.2–1.2)
Total Protein: 7.3 g/dL (ref 6.0–8.3)

## 2016-03-06 LAB — CBC
HEMATOCRIT: 46.5 % (ref 39.0–52.0)
HEMOGLOBIN: 15.6 g/dL (ref 13.0–17.0)
MCHC: 33.6 g/dL (ref 30.0–36.0)
MCV: 88.3 fl (ref 78.0–100.0)
PLATELETS: 278 10*3/uL (ref 150.0–400.0)
RBC: 5.26 Mil/uL (ref 4.22–5.81)
RDW: 13.6 % (ref 11.5–15.5)
WBC: 10 10*3/uL (ref 4.0–10.5)

## 2016-03-06 LAB — HEMOGLOBIN A1C: Hgb A1c MFr Bld: 5.5 % (ref 4.6–6.5)

## 2016-03-06 NOTE — Patient Instructions (Signed)
It was very nice to see you today- take care and continue to use the ranitidine and zyrtec for the time being.  Use benadryl as needed for more severe symptoms. Please do fill the epipen and keep it handy in case you have any more severe symptoms I will be in touch with your labs Wait until you are rash free, then get your flu shot.

## 2016-03-06 NOTE — Progress Notes (Addendum)
[facility] [place] [address], [address] [place], [place] [address] [phone] [phone]  Date:  03/06/2016   Name:  Manuel Boone   DOB:  [birth year]   MRN:  [id number]  PCP:  Abbe AmsterdamOPLAND,Chazz Philson, MD    Chief Complaint: Establish Care (Pt here to re-establish care. c/o hives on face and body. hives are itchy x 1 month. Pt states that sx's come and go. )   History of Present Illness:  Manuel Boone is a 50 y.o. very pleasant male patient who presents with the following:  He is here today to re-establish care with me- [facility]  He got stung by seveal yellow jackets \\over  the summer. He developed hives, went to UC. He was treated with benadryl and solumedrol, prednisone per [facility] and recovered.   Since then he has noted a few episodes of hives.  The cause is so far unknown.   He has been referred to an allergist- his appt is in January He does not have an epipen. This was rx but he did not fill it yet. Encouraged him to go ahead and fill this as he could have a more serious allergic reaction in the future He is facing some financial confusion right now- apparently his accountant died and there was a problem with his taxes, he is hesitant to get too much medical care  He is fasting today for labs He would like to do cholesterol, etc  This am he took an advil for a HA and then developed some hives again on his trunk.  These are now nearly resolved- He is still taking zyrtec and ranitidine- he did take these today.  He has not used any benadryl yet today.  He does not have any SOB, lip or tongue swelling   Patient Active Problem List   Diagnosis Date Noted  . Anxiety about health 02/15/2015    No past medical history on file.  No past surgical history on file.  Social History  Substance Use Topics  . Smoking status: Never Smoker  . Smokeless tobacco: Never Used  . Alcohol use Not on file     Family History  Problem Relation Age of Onset  . Hypertension Father     No Known Allergies  Medication list has been reviewed and updated.  Current Outpatient Prescriptions on File Prior to Visit  Medication Sig Dispense Refill  . cetirizine (ZYRTEC) 10 MG tablet Take 1 tablet (10 mg total) by mouth daily. 30 tablet 11  . diphenhydrAMINE (BENADRYL) 25 MG tablet Take 25 mg by mouth every 6 (six) hours as needed.    . ranitidine (ZANTAC) 150 MG tablet Take 1 tablet (150 mg total) by mouth 2 (two) times daily. 30 tablet 0   No current facility-administered medications on file prior to visit.     Review of Systems:  As per HPI- otherwise negative.   Physical Examination: Blood pressure 108/64, pulse 76, temperature 98 F (36.7 C), temperature source Oral, height 5\' 6"  (1.676 m), weight 163 lb 6.4 oz (74.1 kg), SpO2 97 %.  Vitals:   03/06/16 1107  Weight: 163 lb 6.4 oz (74.1 kg)  Height: 5\' 6"  (1.676 m)   Body mass index is 26.37 kg/m. Ideal Body Weight: Weight in (lb) to have BMI = 25: 154.6  GEN: WDWN, NAD, Non-toxic, A & O x 3 HEENT: Atraumatic, Normocephalic. Neck supple.  No masses, No LAD.  No angioedema  Ears and Nose: No external deformity. CV: RRR, No M/G/R. No JVD. No thrill. No extra heart sounds. PULM: CTA B, no wheezes, crackles, rhonchi. No retractions. No resp. distress. No accessory muscle use. EXTR: No c/c/e NEURO Normal gait.  PSYCH: Normally interactive. Conversant. Not depressed or anxious appearing.  Calm demeanor.  He has clearing hives on his trunk now- he showed me pictures from this am and he is now much better  Assessment and Plan: Hives  Screening for hyperlipidemia - Plan: Lipid panel  Screening for diabetes mellitus - Plan: Comprehensive metabolic panel, Hemoglobin A1c  Screening for deficiency anemia - Plan: CBC  Here today to follow-up on hives- he has an allergist appt pending. Right now he is on zyrtec and zantac as an H2  blocker, is using benadryl as needed only. Urged him to get the epipen filled as it might need it for any more severe reaction.  Encouraged him to go ahead and fill his epipen Will plan further follow- up pending labs.  Signed Abbe AmsterdamJessica Alexya Mcdaris, MD  Results for orders placed or performed in visit on 03/06/16  CBC  Result Value Ref Range   WBC 10.0 4.0 - 10.5 K/uL   RBC 5.26 4.22 - 5.81 Mil/uL   Platelets 278.0 150.0 - 400.0 K/uL   Hemoglobin 15.6 13.0 - 17.0 g/dL   HCT 47.846.5 29.539.0 - 62.152.0 %   MCV 88.3 78.0 - 100.0 fl   MCHC 33.6 30.0 - 36.0 g/dL   RDW 30.813.6 65.711.5 - 84.615.5 %  Comprehensive metabolic panel  Result Value Ref Range   Sodium 138 135 - 145 mEq/L   Potassium 4.3 3.5 - 5.1 mEq/L   Chloride 102 96 - 112 mEq/L   CO2 32 19 - 32 mEq/L   Glucose, Bld 104 (H) 70 - 99 mg/dL   BUN 15 6 - 23 mg/dL   Creatinine, Ser 9.620.85 0.40 - 1.50 mg/dL   Total Bilirubin 0.6 0.2 - 1.2 mg/dL   Alkaline Phosphatase 56 39 - 117 U/L   AST 16 0 - 37 U/L   ALT 16 0 - 53 U/L   Total Protein 7.3 6.0 - 8.3 g/dL   Albumin 4.0 3.5 - 5.2 g/dL   Calcium 9.2 8.4 - 95.210.5 mg/dL   GFR 841.32101.47 >44.01>60.00 mL/min  Lipid panel  Result Value Ref Range   Cholesterol 206 (H) 0 - 200 mg/dL   Triglycerides 02.766.0 0.0 - 149.0 mg/dL   HDL 25.3641.70 >64.40>39.00 mg/dL   VLDL 34.713.2 0.0 - 42.540.0 mg/dL   LDL Cholesterol 956151 (H) 0 - 99 mg/dL   Total CHOL/HDL Ratio 5    NonHDL 164.10   Hemoglobin A1c  Result Value Ref Range   Hgb A1c MFr Bld 5.5 4.6 - 6.5 %   His 10 year CV risk is estimated to be about 7%.  Will encourage omega 3 to raise his HDL and plan recheck in 6-12 months.

## 2016-03-06 NOTE — Progress Notes (Signed)
Pre visit review using our clinic review tool, if applicable. No additional management support is needed unless otherwise documented below in the visit note. 

## 2016-05-15 IMAGING — CR DG SACRUM/COCCYX 2+V
3 series · 3 of 3 positions shown · non-contrast
Comparison: None.

CLINICAL DATA: Low back and sacral pain for the past few days.

EXAM:
LUMBAR SPINE - COMPLETE 4+ VIEW; SACRUM AND COCCYX - 2+ VIEW

[AP]
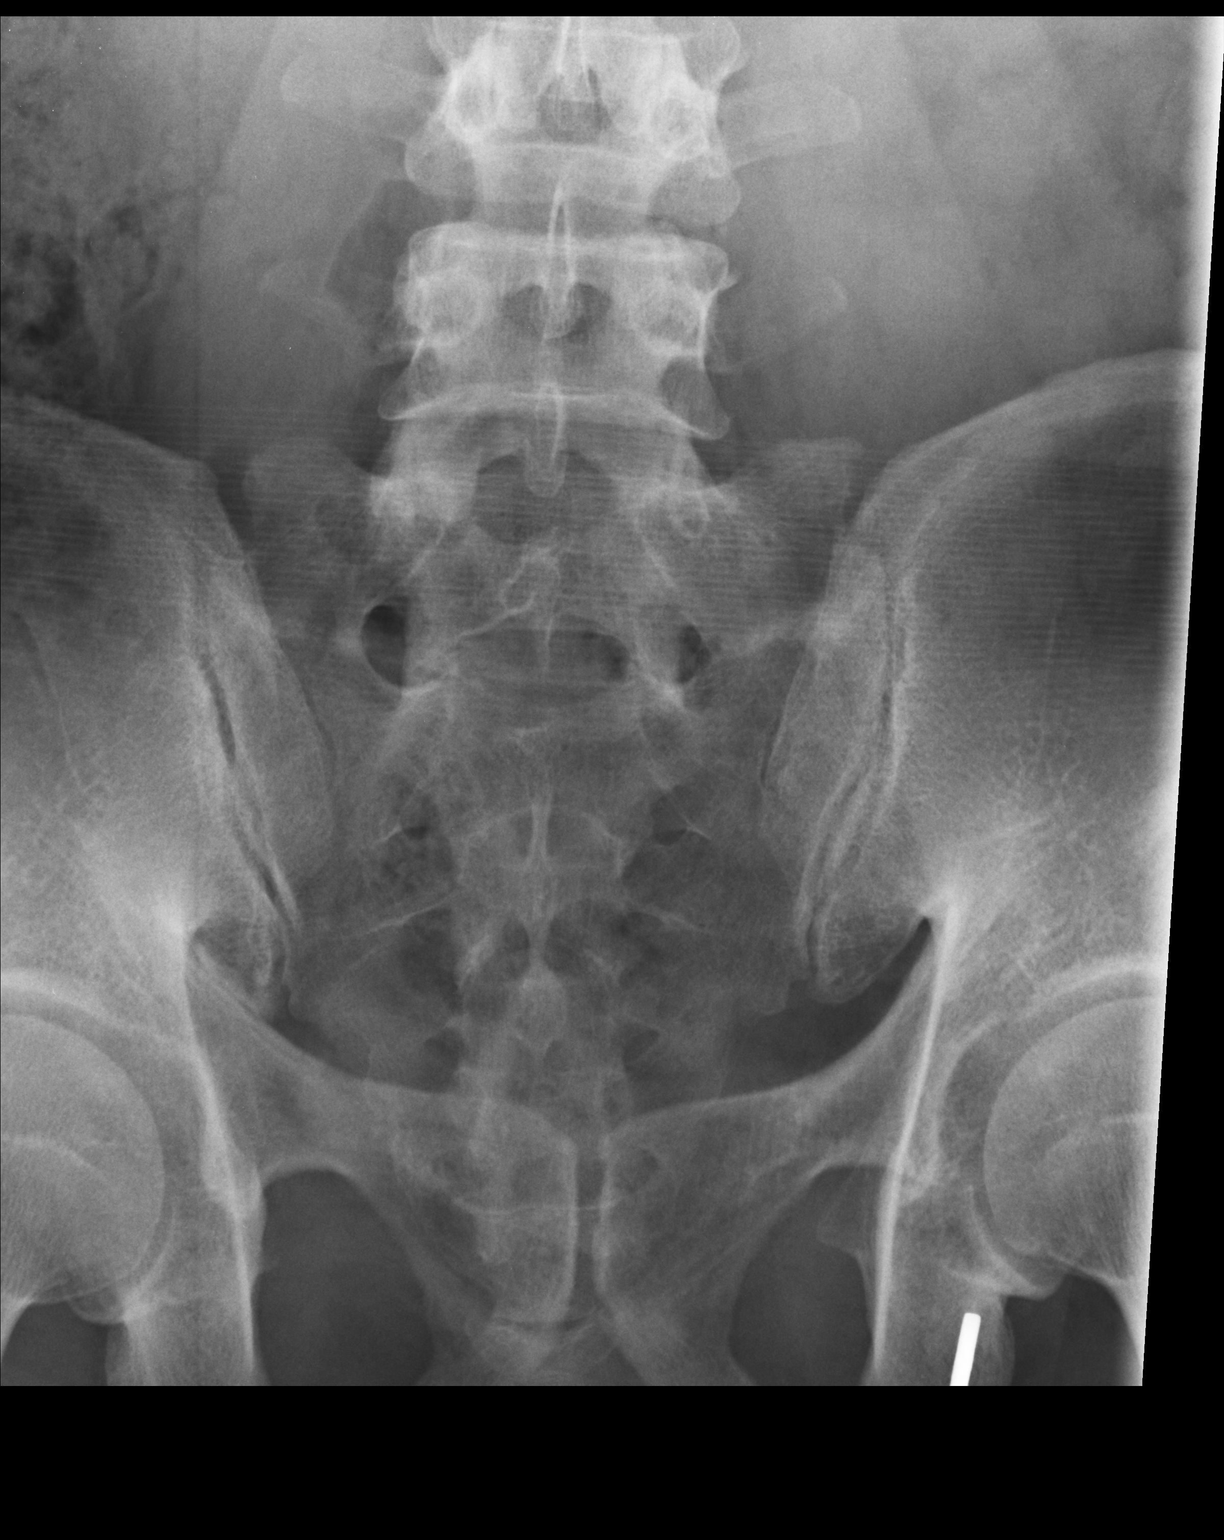

[lateral]
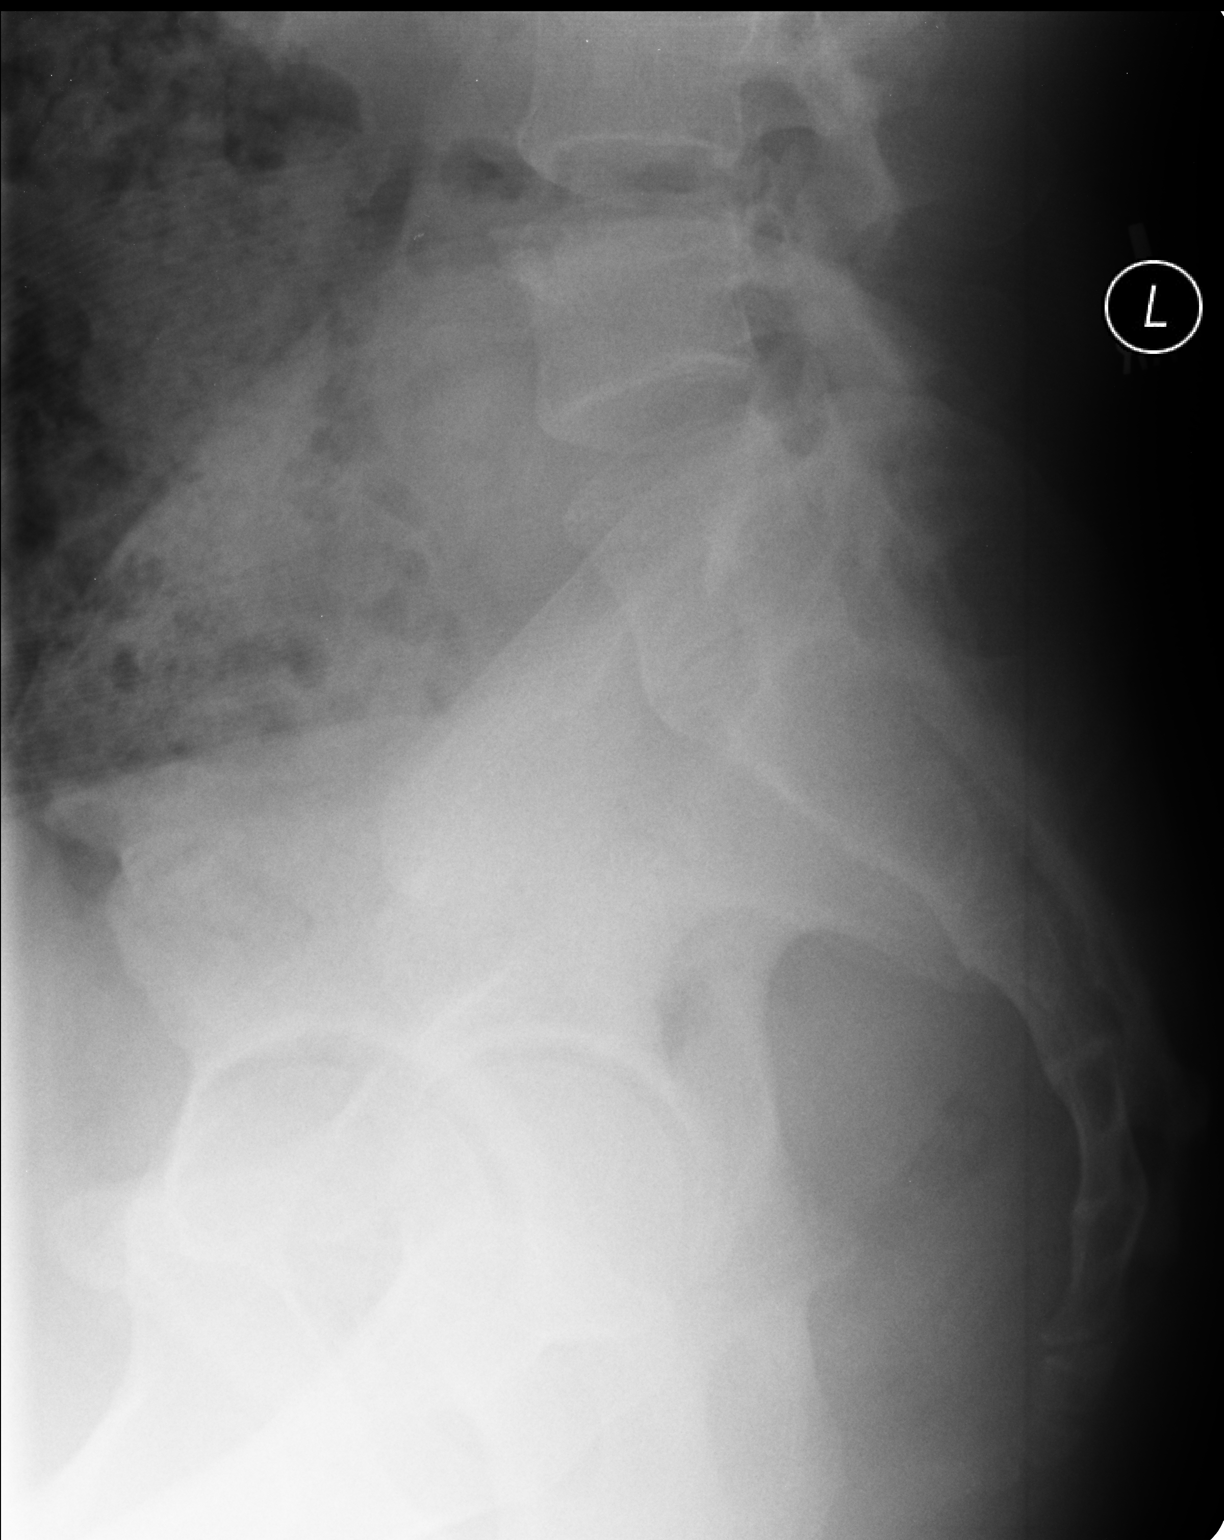

[ap axial]
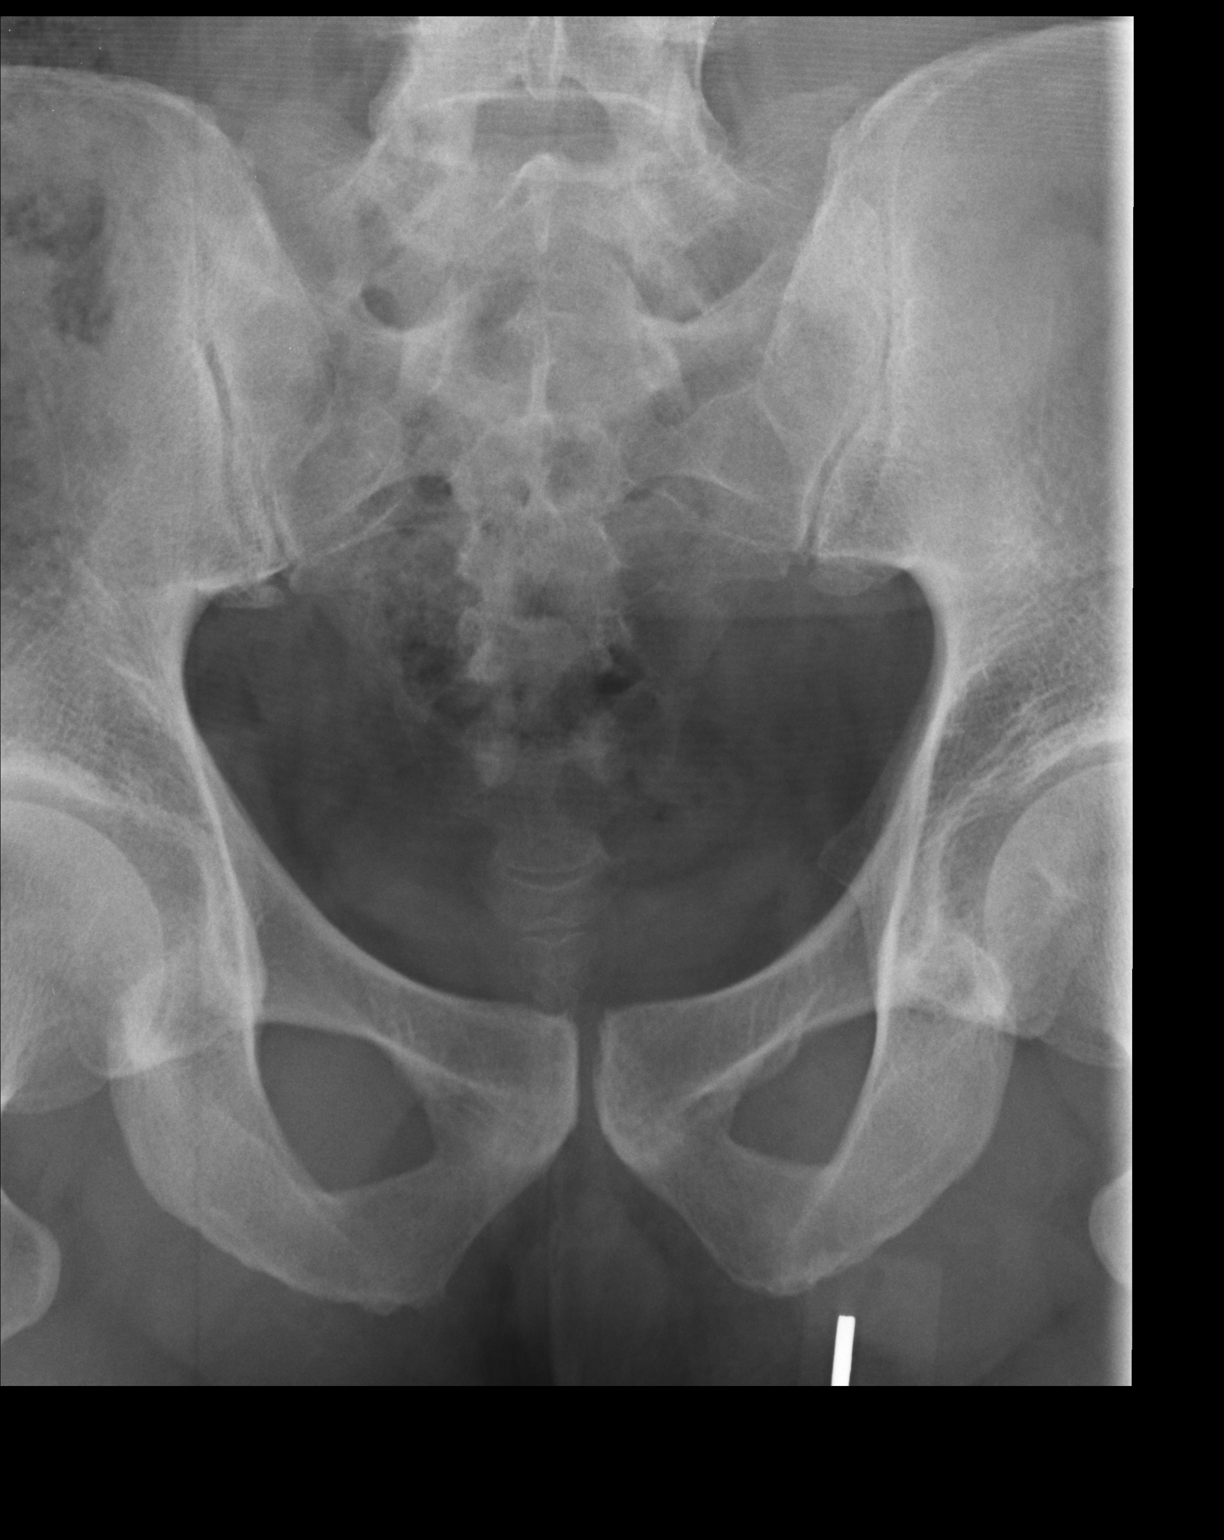

[3 of 3 positions shown; findings below may reference images not displayed]

FINDINGS: Lumbar spine:

Normal alignment of the lumbar vertebral bodies. Disc spaces and
vertebral bodies are maintained. The facets are normally aligned. No
pars defects. The visualized bony pelvis is intact.

Sacrum:

The pubic symphysis and SI joints are intact. No findings for
sacroiliitis. No acute fracture or worrisome bone lesion.
IMPRESSION: Normal alignment of the lumbar vertebral bodies and no acute bony
findings.

Normal plain film examination of the sacrum.

## 2016-05-15 IMAGING — CR DG LUMBAR SPINE COMPLETE 4+V
5 series · 5 of 5 positions shown · non-contrast
Comparison: None.

CLINICAL DATA: Low back and sacral pain for the past few days.

EXAM:
LUMBAR SPINE - COMPLETE 4+ VIEW; SACRUM AND COCCYX - 2+ VIEW

[AP]
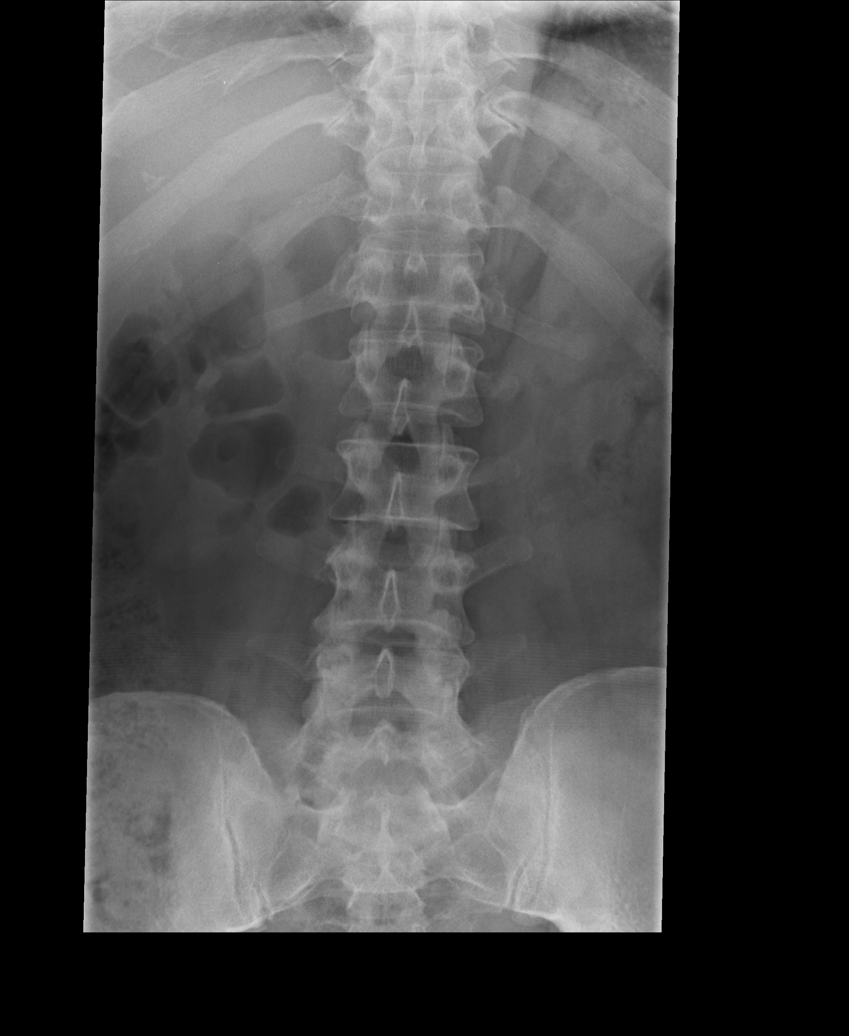

[rpo]
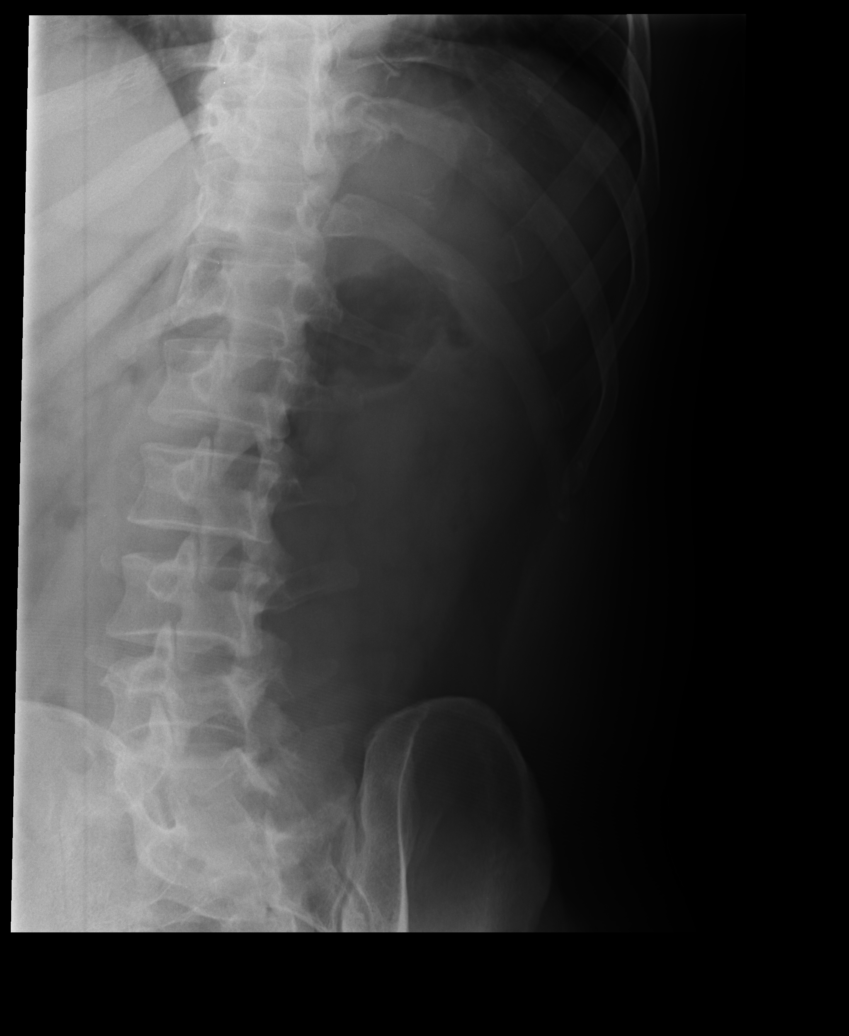

[lpo]
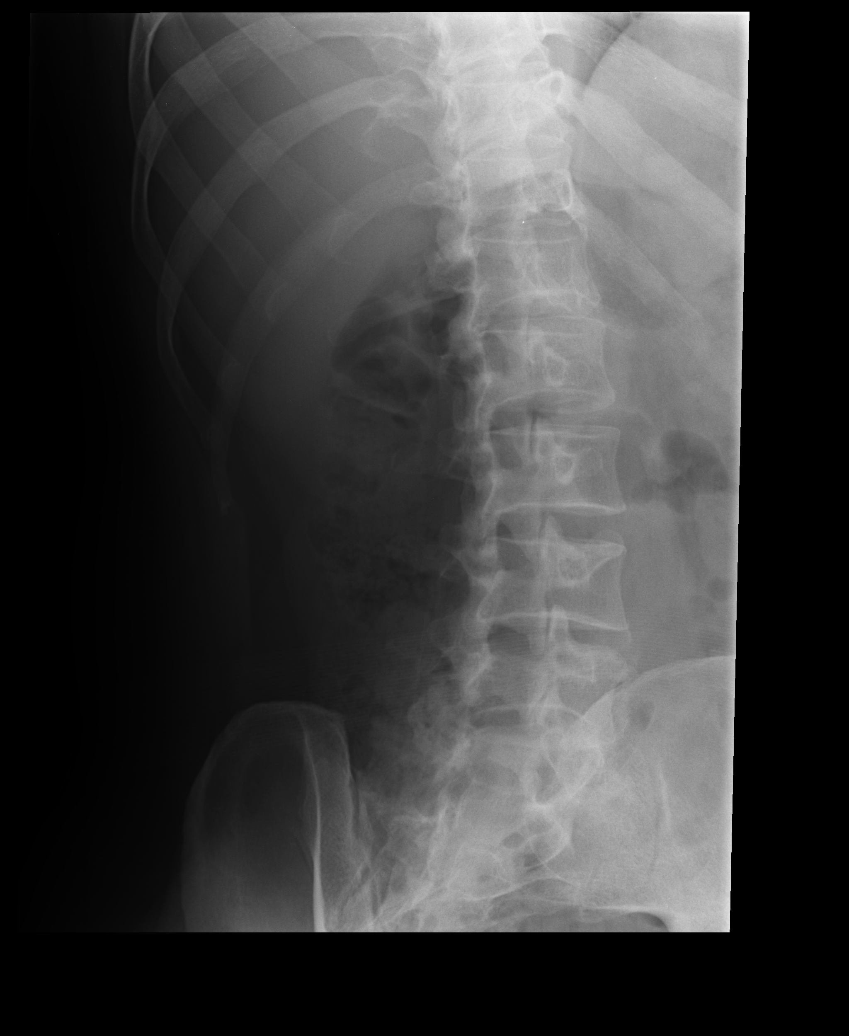

[lateral]
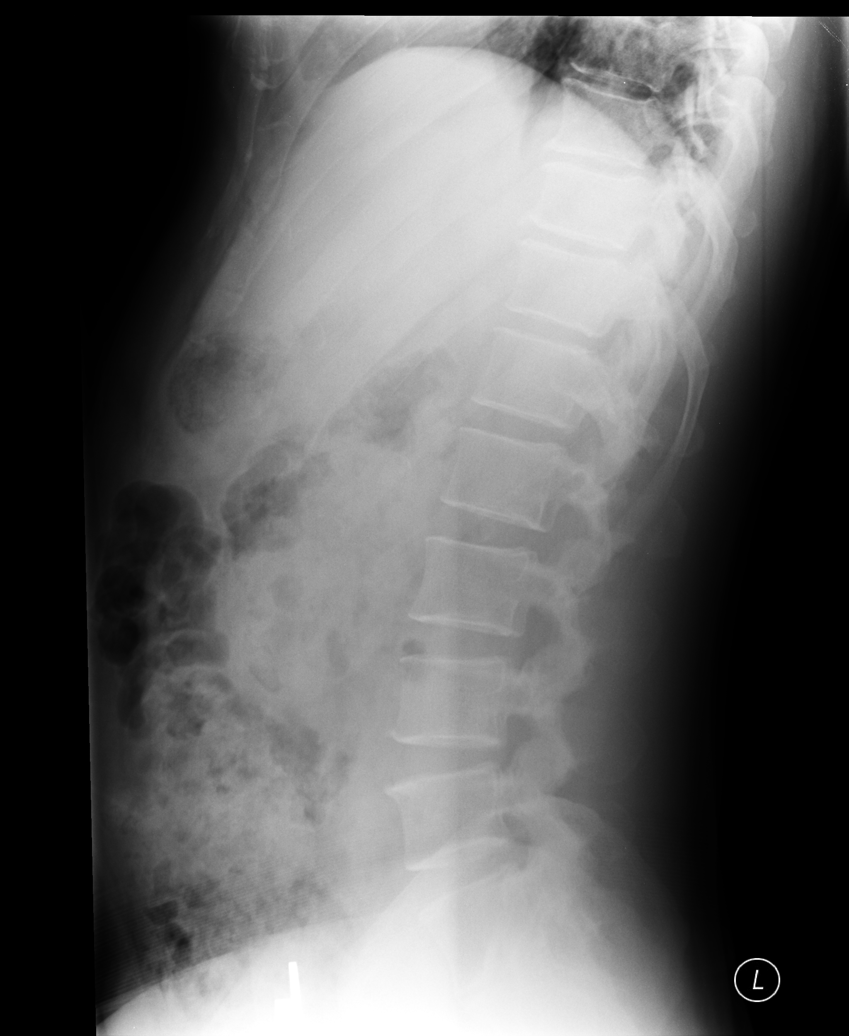

[l5 s1]
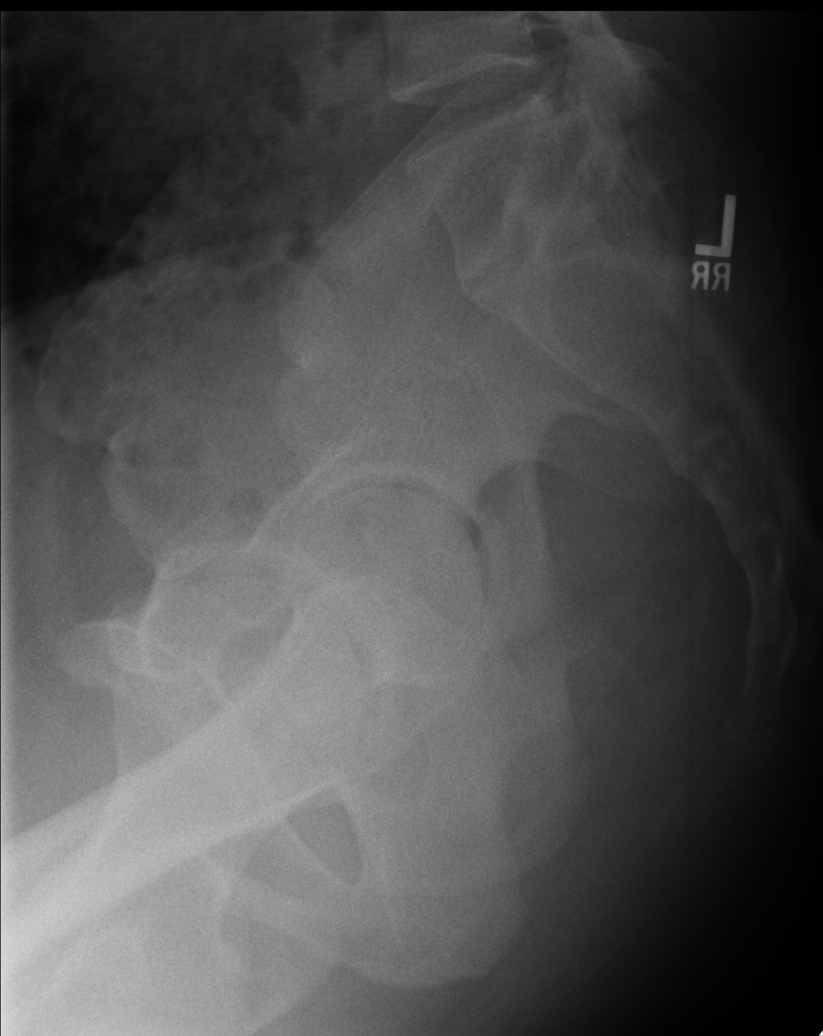

[5 of 5 positions shown; findings below may reference images not displayed]

FINDINGS: Lumbar spine:

Normal alignment of the lumbar vertebral bodies. Disc spaces and
vertebral bodies are maintained. The facets are normally aligned. No
pars defects. The visualized bony pelvis is intact.

Sacrum:

The pubic symphysis and SI joints are intact. No findings for
sacroiliitis. No acute fracture or worrisome bone lesion.
IMPRESSION: Normal alignment of the lumbar vertebral bodies and no acute bony
findings.

Normal plain film examination of the sacrum.

## 2017-01-21 ENCOUNTER — Telehealth: Payer: Self-pay

## 2017-01-21 NOTE — Telephone Encounter (Signed)
[facility] follow up call made to patient. Patient c/o blood in stool. States he had appointment scheduled with Dr. Patsy Lageropland on tomorrow.

## 2017-01-22 ENCOUNTER — Ambulatory Visit (INDEPENDENT_AMBULATORY_CARE_PROVIDER_SITE_OTHER): Payer: Self-pay | Admitting: Family Medicine

## 2017-01-22 ENCOUNTER — Encounter: Payer: Self-pay | Admitting: Family Medicine

## 2017-01-22 VITALS — BP 114/79 | HR 100 | Temp 98.5°F | Ht 66.0 in | Wt 169.4 lb

## 2017-01-22 DIAGNOSIS — Z1211 Encounter for screening for malignant neoplasm of colon: Secondary | ICD-10-CM

## 2017-01-22 DIAGNOSIS — Z23 Encounter for immunization: Secondary | ICD-10-CM

## 2017-01-22 DIAGNOSIS — Z125 Encounter for screening for malignant neoplasm of prostate: Secondary | ICD-10-CM

## 2017-01-22 DIAGNOSIS — Z13220 Encounter for screening for lipoid disorders: Secondary | ICD-10-CM

## 2017-01-22 DIAGNOSIS — Z131 Encounter for screening for diabetes mellitus: Secondary | ICD-10-CM

## 2017-01-22 DIAGNOSIS — Z130 Encounter for screening for diseases of the blood and blood-forming organs and certain disorders involving the immune mechanism: Secondary | ICD-10-CM

## 2017-01-22 DIAGNOSIS — K921 Melena: Secondary | ICD-10-CM

## 2017-01-22 LAB — LIPID PANEL
CHOLESTEROL: 184 mg/dL (ref 0–200)
HDL: 42.1 mg/dL (ref >39.00)
LDL CALC: 113 mg/dL — AB (ref 0–99)
NonHDL: 141.72
TRIGLYCERIDES: 146 mg/dL (ref 0.0–149.0)
Total CHOL/HDL Ratio: 4
VLDL: 29.2 mg/dL (ref 0.0–40.0)

## 2017-01-22 LAB — HEMOCCULT GUIAC POC 1CARD (OFFICE): FECAL OCCULT BLD: NEGATIVE

## 2017-01-22 LAB — CBC
HCT: 44.9 % (ref 39.0–52.0)
HEMOGLOBIN: 14.8 g/dL (ref 13.0–17.0)
MCHC: 32.9 g/dL (ref 30.0–36.0)
MCV: 90.3 fl (ref 78.0–100.0)
PLATELETS: 205 10*3/uL (ref 150.0–400.0)
RBC: 4.97 Mil/uL (ref 4.22–5.81)
RDW: 13.7 % (ref 11.5–15.5)
WBC: 5.1 10*3/uL (ref 4.0–10.5)

## 2017-01-22 LAB — COMPREHENSIVE METABOLIC PANEL
ALK PHOS: 57 U/L (ref 39–117)
ALT: 15 U/L (ref 0–53)
AST: 16 U/L (ref 0–37)
Albumin: 4.1 g/dL (ref 3.5–5.2)
BILIRUBIN TOTAL: 0.7 mg/dL (ref 0.2–1.2)
BUN: 15 mg/dL (ref 6–23)
CALCIUM: 9.4 mg/dL (ref 8.4–10.5)
CO2: 31 meq/L (ref 19–32)
Chloride: 104 mEq/L (ref 96–112)
Creatinine, Ser: 0.94 mg/dL (ref 0.40–1.50)
GFR: 90.02 mL/min (ref >60.00)
Glucose, Bld: 104 mg/dL — ABNORMAL HIGH (ref 70–99)
Potassium: 3.5 mEq/L (ref 3.5–5.1)
Sodium: 141 mEq/L (ref 135–145)
Total Protein: 7.1 g/dL (ref 6.0–8.3)

## 2017-01-22 LAB — PSA: PSA: 1.66 ng/mL (ref 0.10–4.00)

## 2017-01-22 LAB — HEMOGLOBIN A1C: HEMOGLOBIN A1C: 5.2 % (ref 4.6–6.5)

## 2017-01-22 NOTE — Progress Notes (Addendum)
[facility] [address] [place], [place] [address] [phone] [phone]  Date:  01/22/2017   Name:  Manuel CeriseCuong Boone   DOB:  March 14, 1966   MRN:  [id number]  PCP:  Pearline Cablesopland, Gershom Brobeck C, MD    Chief Complaint: Diarrhea (c/o bowel had pink tinge and was watery on Saturday 01/18/17. Flu vaccine today. )   History of Present Illness:  Manuel Boone is a 51 y.o. very pleasant male patient who presents with the following:  Last seen by myself about a year ago.   This past Saturday (today is Wednesday) he went to have a BM- he felt like he had to strain a bit, and he noted that there was some pink/ red blood in the bowl.   A few hours ago he went to the bathroom again and all seemed to be ok No diarrhea or vomiting No abd pain He is eating normally   He has not yet had a colonoscopy  He would like to have a flu shot today as well  Last labs about a year ago- he would like to update today   Patient Active Problem List   Diagnosis Date Noted  . Hives 03/06/2016  . Anxiety about health 02/15/2015    No past medical history on file.  No past surgical history on file.  Social History   Tobacco Use  . Smoking status: Never Smoker  . Smokeless tobacco: Never Used  Substance Use Topics  . Alcohol use: Not on file  . Drug use: No    Family History  Problem Relation Age of Onset  . Hypertension Father     No Known Allergies  Medication list has been reviewed and updated.  No current outpatient medications on file prior to visit.   No current facility-administered medications on file prior to visit.     Review of Systems:  As per HPI- otherwise negative.   Physical Examination: Vitals:   01/22/17 1154  BP: 114/79  Pulse: 100  Temp: 98.5 F (36.9 C)   Vitals:   01/22/17 1154  Weight: 169 lb 6.4 oz (76.8 kg)  Height: 5\' 6"  (1.676 m)   Body mass index is 27.34 kg/m. Ideal Body Weight: Weight in (lb) to have BMI  = 25: 154.6  GEN: WDWN, NAD, Non-toxic, A & O x 3 HEENT: Atraumatic, Normocephalic. Neck supple. No masses, No LAD. Ears and Nose: No external deformity. CV: RRR, No M/G/R. No JVD. No thrill. No extra heart sounds. PULM: CTA B, no wheezes, crackles, rhonchi. No retractions. No resp. distress. No accessory muscle use. ABD: S, NT, ND, +BS. No rebound. No HSM. EXTR: No c/c/e NEURO Normal gait.  PSYCH: Normally interactive. Conversant. Not depressed or anxious appearing.  Calm demeanor.  Gu: no testicular masses Normal DRE- no blood on exam, negative IFOB  Assessment and Plan: Blood in stool - Plan: IFOBT POC (occult bld, rslt in office), Ambulatory referral to Gastroenterology  Immunization due - Plan: Flu Vaccine QUAD 36+ mos IM (Fluarix & Fluzone Quad PF  Colon cancer screening - Plan: Ambulatory referral to Gastroenterology  Screening for diabetes mellitus - Plan: Comprehensive metabolic panel, Hemoglobin A1c  Screening for hyperlipidemia - Plan: Lipid panel  Screening for deficiency anemia - Plan: CBC  Screening for prostate cancer - Plan: PSA  He also notes mild jock itch- recommend OTC lotromin cream for this Referral to GI as he is due for a colonoscopy anyway- however  suspect bleeding was likely due to a fissue or small hemorrhoid Other screening labs as above Flu shot today   Signed Abbe AmsterdamJessica Dalma Panchal, MD  Received his labs  Results for orders placed or performed in visit on 01/22/17  CBC  Result Value Ref Range   WBC 5.1 4.0 - 10.5 K/uL   RBC 4.97 4.22 - 5.81 Mil/uL   Platelets 205.0 150.0 - 400.0 K/uL   Hemoglobin 14.8 13.0 - 17.0 g/dL   HCT 16.144.9 09.639.0 - 04.552.0 %   MCV 90.3 78.0 - 100.0 fl   MCHC 32.9 30.0 - 36.0 g/dL   RDW 40.913.7 81.111.5 - 91.415.5 %  Comprehensive metabolic panel  Result Value Ref Range   Sodium 141 135 - 145 mEq/L   Potassium 3.5 3.5 - 5.1 mEq/L   Chloride 104 96 - 112 mEq/L   CO2 31 19 - 32 mEq/L   Glucose, Bld 104 (H) 70 - 99 mg/dL   BUN 15 6 -  23 mg/dL   Creatinine, Ser 7.820.94 0.40 - 1.50 mg/dL   Total Bilirubin 0.7 0.2 - 1.2 mg/dL   Alkaline Phosphatase 57 39 - 117 U/L   AST 16 0 - 37 U/L   ALT 15 0 - 53 U/L   Total Protein 7.1 6.0 - 8.3 g/dL   Albumin 4.1 3.5 - 5.2 g/dL   Calcium 9.4 8.4 - 95.610.5 mg/dL   GFR 21.3090.02 >86.57>60.00 mL/min  Hemoglobin A1c  Result Value Ref Range   Hgb A1c MFr Bld 5.2 4.6 - 6.5 %  Lipid panel  Result Value Ref Range   Cholesterol 184 0 - 200 mg/dL   Triglycerides 846.9146.0 0.0 - 149.0 mg/dL   HDL 62.9542.10 >28.41>39.00 mg/dL   VLDL 32.429.2 0.0 - 40.140.0 mg/dL   LDL Cholesterol 027113 (H) 0 - 99 mg/dL   Total CHOL/HDL Ratio 4    NonHDL 141.72   PSA  Result Value Ref Range   PSA 1.66 0.10 - 4.00 ng/mL  POCT Occult Blood Stool  Result Value Ref Range   Fecal Occult Blood, POC Negative Negative   Card #1 Date     Card #2 Fecal Occult Blod, POC     Card #2 Date     Card #3 Fecal Occult Blood, POC     Card #3 Date     Letter to pt  Lab Results  Component Value Date   PSA 1.66 01/22/2017   PSA 2.80 02/15/2015

## 2017-01-22 NOTE — Patient Instructions (Signed)
It was good to see you today!  I will be in touch with your labs, and will set you up to see a gastroenterologist about a colonoscopy (colon cancer screening)  Let me know if any more difficulty with significant bleeding!

## 2017-01-31 ENCOUNTER — Other Ambulatory Visit (INDEPENDENT_AMBULATORY_CARE_PROVIDER_SITE_OTHER): Payer: Self-pay

## 2017-01-31 ENCOUNTER — Other Ambulatory Visit: Payer: Self-pay | Admitting: Family Medicine

## 2017-01-31 DIAGNOSIS — Z1211 Encounter for screening for malignant neoplasm of colon: Secondary | ICD-10-CM

## 2017-01-31 LAB — FECAL OCCULT BLOOD, IMMUNOCHEMICAL: FECAL OCCULT BLD: NEGATIVE

## 2017-02-26 ENCOUNTER — Encounter: Payer: Self-pay | Admitting: Family Medicine

## 2017-04-15 ENCOUNTER — Ambulatory Visit: Payer: BLUE CROSS/BLUE SHIELD | Admitting: Physician Assistant

## 2017-04-15 ENCOUNTER — Encounter: Payer: Self-pay | Admitting: Physician Assistant

## 2017-04-15 ENCOUNTER — Other Ambulatory Visit: Payer: Self-pay

## 2017-04-15 VITALS — BP 116/74 | HR 116 | Temp 98.6°F | Resp 18 | Ht 66.0 in | Wt 167.4 lb

## 2017-04-15 DIAGNOSIS — J069 Acute upper respiratory infection, unspecified: Secondary | ICD-10-CM

## 2017-04-15 DIAGNOSIS — B9789 Other viral agents as the cause of diseases classified elsewhere: Secondary | ICD-10-CM

## 2017-04-15 MED ORDER — AZELASTINE HCL 0.1 % NA SOLN: 2.0000 | Freq: Two times a day (BID) | NASAL | 0 refills | Status: AC

## 2017-04-15 MED ORDER — BENZONATATE 100 MG PO CAPS: 100.0000 mg | ORAL_CAPSULE | Freq: Three times a day (TID) | ORAL | 0 refills | Status: AC | PRN

## 2017-04-15 MED ORDER — GUAIFENESIN ER 1200 MG PO TB12
1.0000 | ORAL_TABLET | Freq: Two times a day (BID) | ORAL | 1 refills | Status: AC | PRN
Start: 2017-04-15 — End: ?

## 2017-04-15 NOTE — Patient Instructions (Addendum)
IF you received an x-ray today, you will receive an invoice from [facility]. Please contact [facility] at [phone] with questions or concerns regarding your invoice.   IF you received labwork today, you will receive an invoice from [facility]. Please contact [facility] at [phone] with questions or concerns regarding your invoice.   Our billing staff will not be able to assist you with questions regarding bills from these companies.  You will be contacted with the lab results as soon as they are available. The fastest way to get your results is to activate your My Chart account. Instructions are located on the last page of this paperwork. If you have not heard from [place] regarding the results in 2 weeks, please contact this office.    Viral Respiratory Infection A viral respiratory infection is an illness that affects parts of the body used for breathing, like the lungs, nose, and throat. It is caused by a germ called a virus. Some examples of this kind of infection are:  A cold.  The flu (influenza).  A respiratory syncytial virus (RSV) infection.  How do I know if I have this infection? Most of the time this infection causes:  A stuffy or runny nose.  Yellow or green fluid in the nose.  A cough.  Sneezing.  Tiredness (fatigue).  Achy muscles.  A sore throat.  Sweating or chills.  A fever.  A headache.  How is this infection treated? If the flu is diagnosed early, it may be treated with an antiviral medicine. This medicine shortens the length of time a person has symptoms. Symptoms may be treated with over-the-counter and prescription medicines, such as:  Expectorants. These make it easier to cough up mucus.  Decongestant nasal sprays.  Doctors do not prescribe antibiotic medicines for viral infections. They do not work with this kind of infection. How do I know if I should stay home? To keep others from getting sick, stay home if you  have:  A fever.  A lasting cough.  A sore throat.  A runny nose.  Sneezing.  Muscles aches.  Headaches.  Tiredness.  Weakness.  Chills.  Sweating.  An upset stomach (nausea).  Follow these instructions at home:  Rest as much as possible.  Take over-the-counter and prescription medicines only as told by your doctor.  Drink enough fluid to keep your pee (urine) clear or pale yellow.  Gargle with salt water. Do this 3-4 times per day or as needed. To make a salt-water mixture, dissolve -1 tsp of salt in 1 cup of warm water. Make sure the salt dissolves all the way.  Use nose drops made from salt water. This helps with stuffiness (congestion). It also helps soften the skin around your nose.  Do not drink alcohol.  Do not use tobacco products, including cigarettes, chewing tobacco, and e-cigarettes. If you need help quitting, ask your doctor. Get help if:  Your symptoms last for 10 days or longer.  Your symptoms get worse over time.  You have a fever.  You have very bad pain in your face or forehead.  Parts of your jaw or neck become very swollen. Get help right away if:  You feel pain or pressure in your chest.  You have shortness of breath.  You faint or feel like you will faint.  You keep throwing up (vomiting).  You feel confused. This information is not intended to replace advice given to you by your health care provider. Make  sure you discuss any questions you have with your health care provider. Document Released: 02/15/2008 Document Revised: 08/10/2015 Document Reviewed: 08/10/2014 Elsevier Interactive Patient Education  2018 [employer]   Antibiotics Aren't Always The Answer  CDC Urges Public To Be Antibiotics Aware The Centers for Disease Control and Prevention (CDC) encourages patients and families to Be Antibiotics Aware by learning about safe antibiotic use. Each year in the [place], at least 2 million people get infected with  antibiotic-resistant bacteria. At least 23,000 die as a result. Antibiotic resistance, one of the most urgent threats to the public's health, occurs when bacteria develop the ability to defeat the drugs designed to kill them.  What Do Antibiotics Treat? When you get a prescription for antibiotics, follow your doctor's instructions carefully.  Antibiotics are critical tools for treating a number of common infections, such as pneumonia, and for life-threatening conditions including sepsis. Antibiotics are only needed for treating certain infections caused by bacteria.  What Don't Antibiotics Treat? Antibiotics do not work on viruses, such as colds and flu, or runny noses, even if the mucus is thick, yellow or green. Antibiotics also won't help some common bacterial infections including most cases of bronchitis, many sinus infections, and some ear infections.  What Are The Side Effects of Antibiotics? Any time antibiotics are used, they can cause side effects and lead to antibiotic resistance. When antibiotics aren't needed, they won't help you, and the side effects could still hurt you. Common side effects range from things like rashes and yeast infections to severe health problems. More serious side effects include Clostridium difficile infection (also called C. difficile or C. diff), which causes diarrhea that can lead to severe colon damage and death. If you need antibiotics, take them exactly as prescribed. Patients and families can talk to their healthcare professional if they have any questions about their antibiotics, or if they develop side effects, especially diarrhea, since that could be C. difficile, which needs to be treated.  Can I Feel Better Without Antibiotics? Patients and families can ask their healthcare professional about the best way to feel better while their body fights off the virus. Respiratory viruses usually go away in a week or two without treatment.  How Can I Stay  Healthy? Regular hand-washing can go a long way toward protecting you from germs.  We can all stay healthy and keep others healthy by cleaning our hands, covering our coughs, staying home when sick, and getting recommended vaccines, for the flu, for example. Antibiotics save lives. When a patient needs antibiotics, the benefits outweigh the risks of side effects and antibiotic resistance. Improving the way we take antibiotics helps keep us healthy now, helps fight antibiotic resistance, and ensures that life-saving antibiotics will be available for future generations.  To learn more about antibiotic prescribing and use, visit http://www.mitchell-miller.com/www.cdc.gov/antibiotic-use.

## 2017-04-15 NOTE — Progress Notes (Signed)
Patient ID: Manuel Boone, male    DOB: 1965/08/24, 52 y.o.   MRN: [id number]  PCP: Manuel Cablesopland, Jessica C, MD  Chief Complaint  Patient presents with  . Congestion    x3 days, pt states he feels congested in his face and chest with a cough. Pt states he is coughing up green mucous. Pt states he has been taking OTC Nyquil.    Subjective:   Presents for evaluation of congestion x 3 days. "I have a head cold."  He reports nasal congestion, headache, watery eyes, subjective fever and muscle aches.  Cough produces green sputum in the mornings and is usually non-productive later in the day. Overall his symptoms are improving, and the fever and body aches are resolved.  OTC Dayquil and Nyquil have helped.  Denies sick contacts. Is worried that he will make his family members sick at the upcoming [place] celebrations, and wants an antibiotic to "kill the germs."    Review of Systems Constitutional: Positive for fever.  HENT: Positive for congestion and sinus pressure. Negative for ear discharge, ear pain, hearing loss, postnasal drip, trouble swallowing and voice change.   Eyes: Negative for visual disturbance.  Respiratory: Negative for cough, shortness of breath and wheezing.   Cardiovascular: Negative for chest pain and palpitations.  Gastrointestinal: Negative for anal bleeding, constipation, diarrhea, nausea and vomiting.  Neurological: Positive for headaches.  Hematological: Negative for adenopathy.       Patient Active Problem List   Diagnosis Date Noted  . Hives 03/06/2016  . Anxiety about health 02/15/2015     Prior to Admission medications   Not on File     No Known Allergies     Objective:  Physical Exam  Constitutional: He is oriented to person, place, and time. He appears well-developed and well-nourished. He is active and cooperative. No distress.  BP 116/74 (BP Location: Left Arm, Patient Position: Sitting, Cuff Size: Normal)   Pulse (!) 116    Temp 98.6 F (37 Boone) (Oral)   Resp 18   Ht 5\' 6"  (1.676 m)   Wt 167 lb 6.4 oz (75.9 kg)   SpO2 96%   BMI 27.02 kg/m   HENT:  Head: Normocephalic and atraumatic.  Right Ear: Hearing and external ear normal. A foreign body (cerumen obscyrs visualization of the TM) is present.  Left Ear: Hearing, tympanic membrane, external ear and ear canal normal.  Nose: Rhinorrhea (clear) present. No mucosal edema. Right sinus exhibits no maxillary sinus tenderness and no frontal sinus tenderness. Left sinus exhibits no maxillary sinus tenderness and no frontal sinus tenderness.  Mouth/Throat: Oropharynx is clear and moist.  Eyes: Conjunctivae are normal. No scleral icterus.  Neck: Normal range of motion. Neck supple. No thyromegaly present.  Cardiovascular: Normal rate, regular rhythm and normal heart sounds.  Pulses:      Radial pulses are 2+ on the right side, and 2+ on the left side.  Pulmonary/Chest: Effort normal and breath sounds normal.  Lymphadenopathy:       Head (right side): No tonsillar, no preauricular, no posterior auricular and no occipital adenopathy present.       Head (left side): No tonsillar, no preauricular, no posterior auricular and no occipital adenopathy present.    He has no cervical adenopathy.       Right: No supraclavicular adenopathy present.       Left: No supraclavicular adenopathy present.  Neurological: He is alert and oriented to person, place, and time. No  sensory deficit.  Skin: Skin is warm, dry and intact. No rash noted. No cyanosis or erythema. Nails show no clubbing.  Psychiatric: He has a normal mood and affect. His speech is normal and behavior is normal.           Assessment & Plan:   1. Viral URI with cough Reassurance. Supportive care.  Anticipatory guidance.  RTC if symptoms worsen/persist. - azelastine (ASTELIN) 0.1 % nasal spray; Place 2 sprays into both nostrils 2 (two) times daily. Use in each nostril as directed  Dispense: 30 mL; Refill:  0 - benzonatate (TESSALON) 100 MG capsule; Take 1-2 capsules (100-200 mg total) by mouth 3 (three) times daily as needed for cough.  Dispense: 40 capsule; Refill: 0 - Guaifenesin (MUCINEX MAXIMUM STRENGTH) 1200 MG TB12; Take 1 tablet (1,200 mg total) by mouth every 12 (twelve) hours as needed.  Dispense: 14 tablet; Refill: 1    Return if symptoms worsen or fail to improve.   Manuel Bras, PA-[facility]

## 2017-04-15 NOTE — Progress Notes (Signed)
Subjective:    Patient ID: Manuel Boone, male    DOB: 06-27-1965, 52 y.o.   MRN: [id number]  Chief Complaint  Patient presents with  . Congestion    x3 days, pt states he feels congested in his face and chest with a cough. Pt states he is coughing up green mucous. Pt states he has been taking OTC Nyquil.   Presents today for a "head cold"  Symptoms started 4 days ago. Symptoms include: watery eyes, congestion, headache, cough, stuffy nose, feverish, and achy. Notes the feverish and achy feelings has improved. He coughs up mucous in the mornings, mostly a dry cough during the day. He is feeling better than he did 4 days ago. Denies any sick contacts. Using OTC Nyquil and Dayquil, which helps.   He is very concerned about his family getting sick. His two daughters are celebrating the [place] in a few days and he does not want them to feel bad. He asked about an antibiotic to kill his germs.   Review of Systems  Constitutional: Positive for fever.  HENT: Positive for congestion and sinus pressure. Negative for ear discharge, ear pain, hearing loss, postnasal drip, trouble swallowing and voice change.   Eyes: Negative for visual disturbance.  Respiratory: Negative for cough, shortness of breath and wheezing.   Cardiovascular: Negative for chest pain and palpitations.  Gastrointestinal: Negative for anal bleeding, constipation, diarrhea, nausea and vomiting.  Neurological: Positive for headaches.  Hematological: Negative for adenopathy.   Patient Active Problem List   Diagnosis Date Noted  . Hives 03/06/2016  . Anxiety about health 02/15/2015    No Known Allergies     Objective:   Physical Exam  Constitutional: He is oriented to person, place, and time. He appears well-developed and well-nourished. No distress.  HENT:  Head: Normocephalic.  Right Ear: External ear normal.  Left Ear: Tympanic membrane and ear canal normal.  Ears:  Nose: Rhinorrhea present. Right sinus  exhibits no maxillary sinus tenderness and no frontal sinus tenderness. Left sinus exhibits no maxillary sinus tenderness and no frontal sinus tenderness.  Mouth/Throat: Uvula is midline and oropharynx is clear and moist. No oropharyngeal exudate, posterior oropharyngeal edema or posterior oropharyngeal erythema.  Eyes: Conjunctivae and lids are normal.  Cardiovascular: Regular rhythm. Tachycardia present.  Pulses:      Radial pulses are 2+ on the right side, and 2+ on the left side.  Pulmonary/Chest: Effort normal and breath sounds normal. No accessory muscle usage. No respiratory distress. He has no decreased breath sounds. He has no wheezes. He has no rhonchi. He has no rales.  Lymphadenopathy:       Head (right side): No submental, no submandibular, no tonsillar, no preauricular, no posterior auricular and no occipital adenopathy present.       Head (left side): No submental, no submandibular, no tonsillar, no preauricular, no posterior auricular and no occipital adenopathy present.    He has no cervical adenopathy.  Neurological: He is alert and oriented to person, place, and time.  Psychiatric: He has a normal mood and affect. His behavior is normal.   Blood pressure 116/74, pulse (!) 116, temperature 98.6 F (37 C), temperature source Oral, resp. rate 18, height 5\' 6"  (1.676 m), weight 167 lb 6.4 oz (75.9 kg), SpO2 96 %.     Assessment & Plan:  1. Viral URI with cough Signs and symptoms lead to diagnosis of viral URI with cough. Start Azelastine, Benzonatate, and Guaifenesin. Educated on why an  antibiotic is not being prescribed at this time. Educated on ways to stay healthy and prevent the spread of infection. Instructed to stay hydrated and well rested.   - azelastine (ASTELIN) 0.1 % nasal spray; Place 2 sprays into both nostrils 2 (two) times daily. Use in each nostril as directed  Dispense: 30 mL; Refill: 0 - benzonatate (TESSALON) 100 MG capsule; Take 1-2 capsules (100-200 mg  total) by mouth 3 (three) times daily as needed for cough.  Dispense: 40 capsule; Refill: 0 - Guaifenesin (MUCINEX MAXIMUM STRENGTH) 1200 MG TB12; Take 1 tablet (1,200 mg total) by mouth every 12 (twelve) hours as needed.  Dispense: 14 tablet; Refill: 1  Return if symptoms worsen or fail to improve.  Alfonse Alpersaroline Goebel Hellums, PA-S

## 2017-06-27 ENCOUNTER — Encounter: Payer: Self-pay | Admitting: Medical

## 2017-06-27 ENCOUNTER — Ambulatory Visit: Payer: BLUE CROSS/BLUE SHIELD | Admitting: Medical

## 2017-06-27 VITALS — BP 121/77 | HR 90 | Temp 98.0°F | Ht 67.0 in | Wt 167.4 lb

## 2017-06-27 DIAGNOSIS — J0100 Acute maxillary sinusitis, unspecified: Secondary | ICD-10-CM | POA: Diagnosis not present

## 2017-06-27 DIAGNOSIS — J301 Allergic rhinitis due to pollen: Secondary | ICD-10-CM | POA: Diagnosis not present

## 2017-06-27 DIAGNOSIS — R059 Cough, unspecified: Secondary | ICD-10-CM

## 2017-06-27 DIAGNOSIS — J40 Bronchitis, not specified as acute or chronic: Secondary | ICD-10-CM | POA: Diagnosis not present

## 2017-06-27 DIAGNOSIS — R05 Cough: Secondary | ICD-10-CM

## 2017-06-27 MED ORDER — ALBUTEROL SULFATE HFA 108 (90 BASE) MCG/ACT IN AERS: 2.0000 | INHALATION_SPRAY | Freq: Four times a day (QID) | RESPIRATORY_TRACT | 0 refills | Status: AC | PRN

## 2017-06-27 MED ORDER — AZITHROMYCIN 250 MG PO TABS: ORAL_TABLET | ORAL | 0 refills | Status: AC

## 2017-06-27 MED ORDER — HYDROCODONE-HOMATROPINE 5-1.5 MG/5ML PO SYRP: 5.0000 mL | ORAL_SOLUTION | Freq: Three times a day (TID) | ORAL | 0 refills | Status: AC | PRN

## 2017-06-27 MED ORDER — FLUTICASONE PROPIONATE 50 MCG/ACT NA SUSP: 2.0000 | Freq: Every day | NASAL | 1 refills | Status: AC

## 2017-06-27 NOTE — Patient Instructions (Addendum)
For recent allergic rhinitis will rx flonase for nasal congestion.  For cough will rx hycodan.  For bronchitis and sinus infection, I rx'd azithromycin.  cxr order placed. Can get now if you choose or Monday if not improved.  Albuterol inhaler to us if wheezing.  Follow up in 7 days or as needed

## 2017-06-27 NOTE — Progress Notes (Signed)
Subjective:    Patient ID: Manuel Boone, male    DOB: [birth year], 52 y.o.   MRN: [id number]  HPI   Pt in states last week he had nasal congestion and cough. He had improvement of symptoms then briefly then 2 days ago he got fever, mild frontal ha, nasal congestion and productive cough.   Pt states cough is severe and keeping him up at night.  Maybe faint wheeze.But does not describe classic wheeze.  Some recent sneezing and itching eyes.  Pt expresses concern for pneumonia.    Review of Systems  Constitutional: Positive for fatigue and fever. Negative for chills.  HENT: Positive for congestion, sinus pain and sore throat.        Faint st.  Respiratory: Positive for cough and wheezing. Negative for shortness of breath.        Possible wheeze.  Cardiovascular: Negative for chest pain and palpitations.  Gastrointestinal: Negative for abdominal pain.  Musculoskeletal: Negative for back pain.  Skin: Negative for rash.  Neurological: Negative for dizziness, syncope, weakness, numbness and headaches.  Hematological: Does not bruise/bleed easily.  Psychiatric/Behavioral: Negative for behavioral problems and confusion.    No past medical history on file.   Social History   Socioeconomic History  . Marital status: Married    Spouse name: Not on file  . Number of children: 2  . Years of education: Not on file  . Highest education level: Not on file  Occupational History    Employer: [employer]  Social Needs  . Financial resource strain: Not on file  . Food insecurity:    Worry: Not on file    Inability: Not on file  . Transportation needs:    Medical: Not on file    Non-medical: Not on file  Tobacco Use  . Smoking status: Never Smoker  . Smokeless tobacco: Never Used  Substance and Sexual Activity  . Alcohol use: Not on file  . Drug use: No  . Sexual activity: Yes    Birth control/protection: None  Lifestyle  . Physical activity:    Days per week: Not on file   Minutes per session: Not on file  . Stress: Not on file  Relationships  . Social connections:    Talks on phone: Not on file    Gets together: Not on file    Attends religious service: Not on file    Active member of club or organization: Not on file    Attends meetings of clubs or organizations: Not on file    Relationship status: Not on file  . Intimate partner violence:    Fear of current or ex partner: Not on file    Emotionally abused: Not on file    Physically abused: Not on file    Forced sexual activity: Not on file  Other Topics Concern  . Not on file  Social History Narrative  . Not on file    No past surgical history on file.  Family History  Problem Relation Age of Onset  . Hypertension Father     No Known Allergies  Current Outpatient Medications on File Prior to Visit  Medication Sig Dispense Refill  . azelastine (ASTELIN) 0.1 % nasal spray Place 2 sprays into both nostrils 2 (two) times daily. Use in each nostril as directed 30 mL 0  . benzonatate (TESSALON) 100 MG capsule Take 1-2 capsules (100-200 mg total) by mouth 3 (three) times daily as needed for cough. 40 capsule 0  .  Guaifenesin (MUCINEX MAXIMUM STRENGTH) 1200 MG TB12 Take 1 tablet (1,200 mg total) by mouth every 12 (twelve) hours as needed. 14 tablet 1   No current facility-administered medications on file prior to visit.     BP 121/77   Pulse 90   Temp 98 F (36.7 C) (Oral)   Ht 5\' 7"  (1.702 m)   Wt 167 lb 6.4 oz (75.9 kg)   SpO2 95%   BMI 26.22 kg/m       Objective:   Physical Exam   General  Mental Status - Alert. General Appearance - Well groomed. Not in acute distress.  Skin Rashes- No Rashes.  HEENT Head- Normal. Ear Auditory Canal - Left- Normal. Right - Normal.Tympanic Membrane- Left- Normal. Right- Normal. Eye Sclera/Conjunctiva- Left- Normal. Right- Normal. Nose & Sinuses Nasal Mucosa- Left-  Boggy and Congested. Right-  Boggy and  Congested.Bilateral maxillary and  frontal sinus pressure. Mouth & Throat Lips: Upper Lip- Normal: no dryness, cracking, pallor, cyanosis, or vesicular eruption. Lower Lip-Normal: no dryness, cracking, pallor, cyanosis or vesicular eruption. Buccal Mucosa- Bilateral- No Aphthous ulcers. Oropharynx- No Discharge or Erythema. Tonsils: Characteristics- Bilateral- No Erythema or Congestion. Size/Enlargement- Bilateral- No enlargement. Discharge- bilateral-None.  Neck Neck- Supple. No Masses.   Chest and Lung Exam Auscultation: Breath Sounds:-Clear even and unlabored.  Cardiovascular Auscultation:Rythm- Regular, rate and rhythm. Murmurs & Other Heart Sounds:Ausculatation of the heart reveal- No Murmurs.  Lymphatic Head & Neck General Head & Neck Lymphatics: Bilateral: Description- No Localized lymphadenopathy.      Assessment & Plan:  For recent allergic rhinitis will rx flonase for nasal congestion.  For cough will rx hycodan.  For bronchitis and sinus infection, I rx'd azithromycin.  cxr order placed. Can get now if you choose or Monday if not improved.  Albuterol inhaler to us if wheezing.  Follow up in 7 days or as needed  Whole FoodsEdward Dollye Glasser, VF CorporationPA-C
